# Patient Record
Sex: Male | Born: 1935 | Race: White | Hispanic: No | Marital: Married | Smoking: Former smoker
Health system: Southern US, Community
[De-identification: ages and names within clinical notes are randomized; demographics above are authoritative.]

## PROBLEM LIST (undated history)

## (undated) DIAGNOSIS — E119 Type 2 diabetes mellitus without complications: Secondary | ICD-10-CM

## (undated) DIAGNOSIS — E785 Hyperlipidemia, unspecified: Secondary | ICD-10-CM

## (undated) DIAGNOSIS — E039 Hypothyroidism, unspecified: Secondary | ICD-10-CM

## (undated) DIAGNOSIS — L570 Actinic keratosis: Secondary | ICD-10-CM

## (undated) DIAGNOSIS — I10 Essential (primary) hypertension: Secondary | ICD-10-CM

## (undated) HISTORY — DX: Actinic keratosis: L57.0

## (undated) HISTORY — PX: APPENDECTOMY: SHX54

---

## 2010-12-03 ENCOUNTER — Ambulatory Visit: Payer: Self-pay | Admitting: Ophthalmology

## 2011-01-01 ENCOUNTER — Ambulatory Visit: Payer: Self-pay | Admitting: Ophthalmology

## 2014-03-23 DIAGNOSIS — C4491 Basal cell carcinoma of skin, unspecified: Secondary | ICD-10-CM

## 2014-03-23 HISTORY — DX: Basal cell carcinoma of skin, unspecified: C44.91

## 2017-02-04 DIAGNOSIS — C4492 Squamous cell carcinoma of skin, unspecified: Secondary | ICD-10-CM

## 2017-02-04 HISTORY — DX: Squamous cell carcinoma of skin, unspecified: C44.92

## 2017-07-23 ENCOUNTER — Inpatient Hospital Stay
Admission: EM | Admit: 2017-07-23 | Discharge: 2017-07-28 | DRG: 682 | Disposition: A | Discharge status: Home Health | Payer: Medicare HMO | Attending: Internal Medicine | Admitting: Internal Medicine

## 2017-07-23 ENCOUNTER — Encounter: Payer: Self-pay | Admitting: Emergency Medicine

## 2017-07-23 ENCOUNTER — Emergency Department: Payer: Medicare HMO

## 2017-07-23 ENCOUNTER — Other Ambulatory Visit: Payer: Self-pay

## 2017-07-23 DIAGNOSIS — Z7984 Long term (current) use of oral hypoglycemic drugs: Secondary | ICD-10-CM

## 2017-07-23 DIAGNOSIS — Z79890 Hormone replacement therapy (postmenopausal): Secondary | ICD-10-CM | POA: Diagnosis not present

## 2017-07-23 DIAGNOSIS — E039 Hypothyroidism, unspecified: Secondary | ICD-10-CM | POA: Diagnosis present

## 2017-07-23 DIAGNOSIS — I951 Orthostatic hypotension: Secondary | ICD-10-CM | POA: Diagnosis present

## 2017-07-23 DIAGNOSIS — E860 Dehydration: Secondary | ICD-10-CM | POA: Diagnosis present

## 2017-07-23 DIAGNOSIS — I10 Essential (primary) hypertension: Secondary | ICD-10-CM | POA: Diagnosis present

## 2017-07-23 DIAGNOSIS — I340 Nonrheumatic mitral (valve) insufficiency: Secondary | ICD-10-CM | POA: Diagnosis not present

## 2017-07-23 DIAGNOSIS — K529 Noninfective gastroenteritis and colitis, unspecified: Secondary | ICD-10-CM | POA: Diagnosis present

## 2017-07-23 DIAGNOSIS — J189 Pneumonia, unspecified organism: Secondary | ICD-10-CM | POA: Diagnosis present

## 2017-07-23 DIAGNOSIS — R402142 Coma scale, eyes open, spontaneous, at arrival to emergency department: Secondary | ICD-10-CM | POA: Diagnosis present

## 2017-07-23 DIAGNOSIS — Z87891 Personal history of nicotine dependence: Secondary | ICD-10-CM

## 2017-07-23 DIAGNOSIS — Z833 Family history of diabetes mellitus: Secondary | ICD-10-CM | POA: Diagnosis not present

## 2017-07-23 DIAGNOSIS — E785 Hyperlipidemia, unspecified: Secondary | ICD-10-CM | POA: Diagnosis present

## 2017-07-23 DIAGNOSIS — N179 Acute kidney failure, unspecified: Secondary | ICD-10-CM | POA: Diagnosis present

## 2017-07-23 DIAGNOSIS — E119 Type 2 diabetes mellitus without complications: Secondary | ICD-10-CM | POA: Diagnosis present

## 2017-07-23 DIAGNOSIS — Z79899 Other long term (current) drug therapy: Secondary | ICD-10-CM | POA: Diagnosis not present

## 2017-07-23 DIAGNOSIS — R55 Syncope and collapse: Secondary | ICD-10-CM

## 2017-07-23 DIAGNOSIS — E871 Hypo-osmolality and hyponatremia: Secondary | ICD-10-CM | POA: Diagnosis present

## 2017-07-23 DIAGNOSIS — R402362 Coma scale, best motor response, obeys commands, at arrival to emergency department: Secondary | ICD-10-CM | POA: Diagnosis present

## 2017-07-23 DIAGNOSIS — R402252 Coma scale, best verbal response, oriented, at arrival to emergency department: Secondary | ICD-10-CM | POA: Diagnosis present

## 2017-07-23 HISTORY — DX: Type 2 diabetes mellitus without complications: E11.9

## 2017-07-23 HISTORY — DX: Hyperlipidemia, unspecified: E78.5

## 2017-07-23 HISTORY — DX: Essential (primary) hypertension: I10

## 2017-07-23 HISTORY — DX: Hypothyroidism, unspecified: E03.9

## 2017-07-23 LAB — CBC WITH DIFFERENTIAL/PLATELET
BASOS ABS: 0.1 10*3/uL (ref 0–0.1)
BASOS PCT: 1 %
EOS ABS: 0.6 10*3/uL (ref 0–0.7)
Eosinophils Relative: 7 %
HCT: 36.1 % — ABNORMAL LOW (ref 40.0–52.0)
HEMOGLOBIN: 12.7 g/dL — AB (ref 13.0–18.0)
Lymphocytes Relative: 20 %
Lymphs Abs: 1.6 10*3/uL (ref 1.0–3.6)
MCH: 30.2 pg (ref 26.0–34.0)
MCHC: 35.1 g/dL (ref 32.0–36.0)
MCV: 85.8 fL (ref 80.0–100.0)
Monocytes Absolute: 1 10*3/uL (ref 0.2–1.0)
Monocytes Relative: 12 %
NEUTROS PCT: 60 %
Neutro Abs: 4.9 10*3/uL (ref 1.4–6.5)
Platelets: 224 10*3/uL (ref 150–440)
RBC: 4.21 MIL/uL — AB (ref 4.40–5.90)
RDW: 13.2 % (ref 11.5–14.5)
WBC: 8.2 10*3/uL (ref 3.8–10.6)

## 2017-07-23 LAB — COMPREHENSIVE METABOLIC PANEL
ALBUMIN: 3.6 g/dL (ref 3.5–5.0)
ALK PHOS: 52 U/L (ref 38–126)
ALT: 11 U/L — AB (ref 17–63)
AST: 21 U/L (ref 15–41)
Anion gap: 7 (ref 5–15)
BUN: 30 mg/dL — ABNORMAL HIGH (ref 6–20)
CALCIUM: 9.2 mg/dL (ref 8.9–10.3)
CO2: 25 mmol/L (ref 22–32)
CREATININE: 1.56 mg/dL — AB (ref 0.61–1.24)
Chloride: 102 mmol/L (ref 101–111)
GFR calc Af Amer: 47 mL/min — ABNORMAL LOW (ref >60)
GFR calc non Af Amer: 41 mL/min — ABNORMAL LOW (ref >60)
GLUCOSE: 121 mg/dL — AB (ref 65–99)
Potassium: 4.7 mmol/L (ref 3.5–5.1)
SODIUM: 134 mmol/L — AB (ref 135–145)
Total Bilirubin: 1 mg/dL (ref 0.3–1.2)
Total Protein: 7.1 g/dL (ref 6.5–8.1)

## 2017-07-23 LAB — URINALYSIS, COMPLETE (UACMP) WITH MICROSCOPIC
BACTERIA UA: NONE SEEN
BILIRUBIN URINE: NEGATIVE
Glucose, UA: NEGATIVE mg/dL
Hgb urine dipstick: NEGATIVE
Ketones, ur: NEGATIVE mg/dL
Leukocytes, UA: NEGATIVE
NITRITE: NEGATIVE
PROTEIN: NEGATIVE mg/dL
SPECIFIC GRAVITY, URINE: 1.009 (ref 1.005–1.030)
Squamous Epithelial / LPF: NONE SEEN
pH: 7 (ref 5.0–8.0)

## 2017-07-23 LAB — GLUCOSE, CAPILLARY
GLUCOSE-CAPILLARY: 138 mg/dL — AB (ref 65–99)
Glucose-Capillary: 142 mg/dL — ABNORMAL HIGH (ref 65–99)
Glucose-Capillary: 89 mg/dL (ref 65–99)

## 2017-07-23 LAB — HEMOGLOBIN A1C
Hgb A1c MFr Bld: 5.8 % — ABNORMAL HIGH (ref 4.8–5.6)
MEAN PLASMA GLUCOSE: 119.76 mg/dL

## 2017-07-23 LAB — TSH: TSH: 0.627 u[IU]/mL (ref 0.350–4.500)

## 2017-07-23 LAB — TROPONIN I: Troponin I: <0.03 ng/mL (ref <0.03)

## 2017-07-23 LAB — CK: Total CK: 66 U/L (ref 49–397)

## 2017-07-23 MED ORDER — TIMOLOL MALEATE 0.5 % OP SOLN
1.0000 [drp] | Freq: Every day | OPHTHALMIC | Status: DC
  Administered 2017-07-23 – 2017-07-27 (×4): 1 [drp] via OPHTHALMIC
  Filled 2017-07-23: qty 5

## 2017-07-23 MED ORDER — EZETIMIBE-SIMVASTATIN 10-40 MG PO TABS
1.0000 | ORAL_TABLET | Freq: Every day | ORAL | Status: DC
  Filled 2017-07-23: qty 1

## 2017-07-23 MED ORDER — SODIUM CHLORIDE 0.9 % IV BOLUS
1000.0000 mL | Freq: Once | INTRAVENOUS | Status: AC
Start: 2017-07-23 — End: 2017-07-23
  Administered 2017-07-23: 1000 mL via INTRAVENOUS

## 2017-07-23 MED ORDER — ACETAMINOPHEN 500 MG PO TABS
1000.0000 mg | ORAL_TABLET | Freq: Once | ORAL | Status: AC
  Administered 2017-07-23: 1000 mg via ORAL
  Filled 2017-07-23: qty 2

## 2017-07-23 MED ORDER — OXYCODONE HCL 5 MG PO TABS
5.0000 mg | ORAL_TABLET | ORAL | Status: DC | PRN
  Administered 2017-07-23 – 2017-07-24 (×5): 5 mg via ORAL
  Filled 2017-07-23 (×7): qty 1

## 2017-07-23 MED ORDER — OXYCODONE HCL 5 MG PO TABS
5.0000 mg | ORAL_TABLET | Freq: Once | ORAL | Status: AC
  Administered 2017-07-23: 5 mg via ORAL
  Filled 2017-07-23: qty 1

## 2017-07-23 MED ORDER — ATORVASTATIN CALCIUM 20 MG PO TABS
40.0000 mg | ORAL_TABLET | Freq: Every day | ORAL | Status: DC
  Administered 2017-07-23 – 2017-07-27 (×5): 40 mg via ORAL
  Filled 2017-07-23 (×6): qty 2

## 2017-07-23 MED ORDER — ONDANSETRON HCL 4 MG/2ML IJ SOLN
4.0000 mg | Freq: Four times a day (QID) | INTRAMUSCULAR | Status: DC | PRN
  Administered 2017-07-24 – 2017-07-25 (×4): 4 mg via INTRAVENOUS
  Filled 2017-07-23 (×4): qty 2

## 2017-07-23 MED ORDER — ONDANSETRON HCL 4 MG/2ML IJ SOLN
4.0000 mg | Freq: Once | INTRAMUSCULAR | Status: AC
  Administered 2017-07-23: 4 mg via INTRAVENOUS
  Filled 2017-07-23: qty 2

## 2017-07-23 MED ORDER — ACETAMINOPHEN 325 MG PO TABS
650.0000 mg | ORAL_TABLET | Freq: Four times a day (QID) | ORAL | Status: DC | PRN
  Administered 2017-07-26 – 2017-07-28 (×5): 650 mg via ORAL
  Filled 2017-07-23 (×5): qty 2

## 2017-07-23 MED ORDER — INSULIN ASPART 100 UNIT/ML ~~LOC~~ SOLN
0.0000 [IU] | Freq: Three times a day (TID) | SUBCUTANEOUS | Status: DC
  Administered 2017-07-23 – 2017-07-24 (×2): 1 [IU] via SUBCUTANEOUS
  Administered 2017-07-26: 2 [IU] via SUBCUTANEOUS
  Administered 2017-07-27: 13:00:00 1 [IU] via SUBCUTANEOUS
  Filled 2017-07-23 (×4): qty 1

## 2017-07-23 MED ORDER — LEVOTHYROXINE SODIUM 50 MCG PO TABS
50.0000 ug | ORAL_TABLET | Freq: Every day | ORAL | Status: DC
  Administered 2017-07-24 – 2017-07-27 (×4): 50 ug via ORAL
  Filled 2017-07-23: qty 1
  Filled 2017-07-23: qty 2
  Filled 2017-07-23 (×2): qty 1

## 2017-07-23 MED ORDER — TIMOLOL MALEATE 0.5 % OP SOLN
1.0000 [drp] | Freq: Every day | OPHTHALMIC | Status: DC
  Filled 2017-07-23: qty 5

## 2017-07-23 MED ORDER — EZETIMIBE 10 MG PO TABS
10.0000 mg | ORAL_TABLET | Freq: Every day | ORAL | Status: DC
Start: 2017-07-23 — End: 2017-07-28
  Administered 2017-07-23 – 2017-07-27 (×5): 10 mg via ORAL
  Filled 2017-07-23 (×7): qty 1

## 2017-07-23 MED ORDER — ONDANSETRON HCL 4 MG PO TABS
4.0000 mg | ORAL_TABLET | Freq: Four times a day (QID) | ORAL | Status: DC | PRN
  Administered 2017-07-25 (×2): 4 mg via ORAL
  Filled 2017-07-23 (×2): qty 1

## 2017-07-23 MED ORDER — ENOXAPARIN SODIUM 40 MG/0.4ML ~~LOC~~ SOLN
40.0000 mg | SUBCUTANEOUS | Status: DC
  Administered 2017-07-24 – 2017-07-27 (×2): 40 mg via SUBCUTANEOUS
  Filled 2017-07-23 (×3): qty 0.4

## 2017-07-23 MED ORDER — MORPHINE SULFATE (PF) 2 MG/ML IV SOLN
2.0000 mg | INTRAVENOUS | Status: DC | PRN
  Administered 2017-07-24 – 2017-07-25 (×3): 2 mg via INTRAVENOUS
  Filled 2017-07-23 (×3): qty 1

## 2017-07-23 MED ORDER — INSULIN ASPART 100 UNIT/ML ~~LOC~~ SOLN: 0.0000 [IU] | Freq: Every day | SUBCUTANEOUS | Status: DC

## 2017-07-23 MED ORDER — TAMSULOSIN HCL 0.4 MG PO CAPS
0.4000 mg | ORAL_CAPSULE | Freq: Every day | ORAL | Status: DC
  Administered 2017-07-24 – 2017-07-27 (×4): 0.4 mg via ORAL
  Filled 2017-07-23 (×4): qty 1

## 2017-07-23 MED ORDER — SODIUM CHLORIDE 0.9 % IV SOLN
INTRAVENOUS | Status: DC
  Administered 2017-07-23 – 2017-07-24 (×2): via INTRAVENOUS

## 2017-07-23 MED ORDER — ACETAMINOPHEN 650 MG RE SUPP: 650.0000 mg | Freq: Four times a day (QID) | RECTAL | Status: DC | PRN

## 2017-07-23 NOTE — ED Provider Notes (Signed)
[facility] Emergency Department Provider Note  ____________________________________________  Time seen: Approximately 9:43 AM  I have reviewed the triage vital signs and the nursing notes.   HISTORY  Chief Complaint Weakness   HPI Richard Acosta is a 82 y.o. male with a history of diabetes, hypertension, hypothyroidism who presents for evaluation of a syncopal event.  Patient reports that he got up and went to the bathroom to take his thyroid medication at 5:30 AM.  He felt dizzy and passed out.  He denies hurting himself after he collapsed.  His wife heard him and by the time she got to the bathroom he was already awake.  No seizure-like activity, no postictal state.  Patient is not on blood thinners.  He denies headache, chest pain, neck pain, back pain, extremity pain.  Patient denies any preceding symptoms other than dizziness prior to the syncope.  He denies chest pain shortness of breath, abdominal pain.  Patient does report having diarrhea over the weekend however that has subsided.  The wife reports the patient has not been eating and drinking well for a few weeks.  She is concerned he is dehydrated.  Past Medical History:  Diagnosis Date  . Diabetes mellitus without complication ([place])   . Hypertension     There are no active problems to display for this patient.   Past Surgical History:  Procedure Laterality Date  . APPENDECTOMY      Prior to Admission medications   Medication Sig Start Date End Date Taking? Authorizing Provider  ciprofloxacin (CIPRO) 500 MG tablet Take 500 mg by mouth daily with breakfast.   Yes [provider]  ezetimibe-simvastatin (VYTORIN) 10-40 MG tablet Take 1 tablet by mouth daily.   Yes [provider]  levothyroxine (SYNTHROID, LEVOTHROID) 50 MCG tablet Take 50 mcg by mouth daily before breakfast.   Yes [provider]  lisinopril (PRINIVIL,ZESTRIL) 10 MG tablet Take 10 mg by mouth daily.    Yes [provider]  metFORMIN (GLUCOPHAGE) 1000 MG tablet Take 1,000 mg by mouth daily with breakfast.   Yes [provider]  tamsulosin (FLOMAX) 0.4 MG CAPS capsule Take 0.4 mg by mouth daily after supper.   Yes [provider]  timolol (BETIMOL) 0.5 % ophthalmic solution Place 1 drop into both eyes daily.   Yes [provider]    Allergies Patient has no known allergies.  No family history on file.  Social History Social History   Tobacco Use  . Smoking status: Former Research scientist (life sciences)  . Smokeless tobacco: Never Used  Substance Use Topics  . Alcohol use: Yes    Alcohol/week: 1.2 oz    Types: 2 Glasses of wine per week  . Drug use: Not Currently    Review of Systems  Constitutional: Negative for fever. + syncope Eyes: Negative for visual changes. ENT: Negative for sore throat. Neck: No neck pain  Cardiovascular: Negative for chest pain. Respiratory: Negative for shortness of breath. Gastrointestinal: Negative for abdominal pain, vomiting. + diarrhea. Genitourinary: Negative for dysuria. Musculoskeletal: Negative for back pain. Skin: Negative for rash. Neurological: Negative for headaches, weakness or numbness. Psych: No SI or HI  ____________________________________________   PHYSICAL EXAM:  VITAL SIGNS: ED Triage Vitals  Enc Vitals Group     BP 07/23/17 0807 (!) 111/97     Pulse Rate 07/23/17 0807 71     Resp 07/23/17 0807 19     Temp 07/23/17 0807 97.8 F (36.6 C)  Temp Source 07/23/17 0807 Oral     SpO2 07/23/17 0807 100 %     Weight 07/23/17 0808 157 lb (71.2 kg)     Height 07/23/17 0808 5\' 6"  (1.676 m)     Head Circumference --      Peak Flow --      Pain Score 07/23/17 0807 5     Pain Loc --      Pain Edu? --      Excl. in [place]? --    Full spinal precautions maintained throughout the trauma exam. Constitutional: Alert and oriented. No acute distress. Does not appear intoxicated. HEENT Head: Normocephalic and  atraumatic. Face: No facial bony tenderness. Stable midface Ears: No hemotympanum bilaterally. No Battle sign Eyes: No eye injury. PERRL. No raccoon eyes Nose: Nontender. No epistaxis. No rhinorrhea Mouth/Throat: Mucous membranes are dry. No oropharyngeal blood. No dental injury. Airway patent without stridor. Normal voice. Neck: no C-collar in place. No midline c-spine tenderness.  Cardiovascular: Normal rate, regular rhythm. Normal and symmetric distal pulses are present in all extremities. Pulmonary/Chest: Chest wall is stable and nontender to palpation/compression. Normal respiratory effort. Breath sounds are normal. No crepitus.  Abdominal: Soft, nontender, non distended. Musculoskeletal: Nontender with normal full range of motion in all extremities. No deformities. No thoracic or lumbar midline spinal tenderness. Pelvis is stable. Skin: Skin is warm, dry and intact. No abrasions or contutions. Psychiatric: Speech and behavior are appropriate. Neurological: Normal speech and language. Moves all extremities to command. No gross focal neurologic deficits are appreciated.  Glascow Coma Score: 4 - Opens eyes on own 6 - Follows simple motor commands 5 - Alert and oriented GCS: 15  ____________________________________________   LABS (all labs ordered are listed, but only abnormal results are displayed)  Labs Reviewed  CBC WITH DIFFERENTIAL/PLATELET - Abnormal; Notable for the following components:      Result Value   RBC 4.21 (*)    Hemoglobin 12.7 (*)    HCT 36.1 (*)    All other components within normal limits  COMPREHENSIVE METABOLIC PANEL - Abnormal; Notable for the following components:   Sodium 134 (*)    Glucose, Bld 121 (*)    BUN 30 (*)    Creatinine, Ser 1.56 (*)    ALT 11 (*)    GFR calc non Af Amer 41 (*)    GFR calc Af Amer 47 (*)    All other components within normal limits  URINALYSIS, COMPLETE (UACMP) WITH MICROSCOPIC - Abnormal; Notable for the following  components:   Color, Urine STRAW (*)    APPearance CLEAR (*)    All other components within normal limits  TROPONIN I   ____________________________________________  EKG  ED ECG REPORT I, Rudene Re, the attending physician, personally viewed and interpreted this ECG.  Normal sinus rhythm, normal intervals, normal axis, no STE or depressions, no evidence of HOCM, AV block, delta wave, ARVD, prolonged QTc, WPW, or [place].  No prior for comparison.  ____________________________________________  RADIOLOGY  I have personally reviewed the images performed during this visit and I agree with the Radiologist's read.   Interpretation by Radiologist:  Ct Head Wo Contrast  Result Date: 07/23/2017 CLINICAL DATA:  Dizziness and syncope.  Fall EXAM: CT HEAD WITHOUT CONTRAST TECHNIQUE: Contiguous axial images were obtained from the base of the skull through the vertex without intravenous contrast. COMPARISON:  None. FINDINGS: Brain: There is age related volume loss. There is no intracranial mass, hemorrhage, extra-axial fluid collection, or midline shift.  There is mild small vessel disease in the centra semiovale bilaterally. Elsewhere gray-white compartments appear normal. No evident acute infarct. Vascular: No hyperdense vessels. There is minimal calcification in each carotid siphon. Skull: Bony calvarium appears intact. Sinuses/Orbits: There is mucosal thickening in several ethmoid air cells. Other visualized paranasal sinuses are clear. Visualized orbits appear symmetric bilaterally. Other: Visualized mastoid air cells are clear. IMPRESSION: Age related volume loss with slight periventricular small vessel disease. No acute infarct. No mass or hemorrhage. Minimal arterial vascular calcification noted. Mucosal thickening noted in several ethmoid air cells. Electronically Signed   By: Lowella Grip III M.D.   On: 07/23/2017 09:17      ____________________________________________   PROCEDURES  Procedure(s) performed: None Procedures Critical Care performed:  None ____________________________________________   INITIAL IMPRESSION / ASSESSMENT AND PLAN / ED COURSE   82 y.o. male with a history of diabetes, hypertension, hypothyroidism who presents for evaluation of a syncopal event in the setting of recent diarrheal illness and decreased oral intake.  No evidence of trauma on exam however since syncope was unwitnessed I will send patient for CT head to rule out intracranial injury.  EKG with no evidence of dysrhythmias or ischemia.  Vitals are within normal limits but patient is orthostatic.  Labs showing acute kidney injury.  Presentation concerning for dehydration.  Patient will receive 1 L normal saline and will be admitted to the hospitalist service.      As part of my medical decision making, I reviewed the following data within the [place] History obtained from family, Nursing notes reviewed and incorporated, Labs reviewed , EKG interpreted , Radiograph reviewed , Discussed with admitting physician , Notes from prior ED visits and [place] Controlled Substance Database    Pertinent labs & imaging results that were available during my care of the patient were reviewed by me and considered in my medical decision making (see chart for details).    ____________________________________________   FINAL CLINICAL IMPRESSION(S) / ED DIAGNOSES  Final diagnoses:  AKI (acute kidney injury) ([place])  Syncope, unspecified syncope type      NEW MEDICATIONS STARTED DURING THIS VISIT:  ED Discharge Orders    None       Note:  This document was prepared using Dragon voice recognition software and may include unintentional dictation errors.    Rudene Re, MD 07/23/17 [phone]

## 2017-07-23 NOTE — ED Notes (Signed)
Pt has not eaten meal today and doesn't want [place] tray. Pt will order meal when he arrives to room

## 2017-07-23 NOTE — H&P (Signed)
Richard Acosta at [place] NAME: Richard Acosta    MR#:  [id number]  DATE OF BIRTH:  [birth year]  DATE OF ADMISSION:  07/23/2017  PRIMARY CARE PHYSICIAN:  Dr. Ronnald Collum  REQUESTING/REFERRING PHYSICIAN: Dr. Rudene Re  CHIEF COMPLAINT:   Chief Complaint  Patient presents with  . Weakness    HISTORY OF PRESENT ILLNESS:  Richard Acosta  is a 82 y.o. male with a known history of hypertension, non-insulin-dependent diabetes mellitus, hypothyroidism presents to hospital secondary to dizziness and syncopal episode. Patient has been having low back pain and generalized body aches and joint pains for almost a month.  He has become very weak.  4 days ago he has had several diarrhea episodes which were self-limited.  Denies any fevers or chills.  No nausea or vomiting.  No cough or chest pain or congestion.  His appetite has been very poor over the last month.  Denies any melena or dark stools.  Lost about 5 pounds in the last month.  He has been eating and drinking very minimal.  When he woke up this morning and stood to get his medicine, he felt dizzy and then passed out.  He is positive with orthostatic hypotension here.  Labs indicate acute renal failure.  He is being admitted for the same.  PAST MEDICAL HISTORY:   Past Medical History:  Diagnosis Date  . Diabetes mellitus without complication ([place])   . Hyperlipidemia   . Hypertension   . Hypothyroidism     PAST SURGICAL HISTORY:   Past Surgical History:  Procedure Laterality Date  . APPENDECTOMY      SOCIAL HISTORY:   Social History   Tobacco Use  . Smoking status: Former Research scientist (life sciences)  . Smokeless tobacco: Never Used  . Tobacco comment: quit about 50 years ago  Substance Use Topics  . Alcohol use: Not Currently    Alcohol/week: 1.2 oz    Types: 2 Glasses of wine per week    Frequency: Never    FAMILY HISTORY:   Family History  Problem Relation Age of Onset  . Diabetes Mother   .  Diabetes Father     DRUG ALLERGIES:  No Known Allergies  REVIEW OF SYSTEMS:   Review of Systems  Constitutional: Positive for malaise/fatigue and weight loss. Negative for chills and fever.  HENT: Negative for ear discharge, ear pain, hearing loss, nosebleeds and tinnitus.   Eyes: Negative for blurred vision, double vision and photophobia.  Respiratory: Negative for cough, hemoptysis, shortness of breath and wheezing.   Cardiovascular: Negative for chest pain, palpitations, orthopnea and leg swelling.  Gastrointestinal: Positive for diarrhea. Negative for abdominal pain, constipation, heartburn, melena, nausea and vomiting.  Genitourinary: Negative for dysuria, frequency, hematuria and urgency.  Musculoskeletal: Positive for back pain, joint pain and myalgias. Negative for neck pain.  Skin: Negative for rash.  Neurological: Positive for dizziness. Negative for tingling, tremors, sensory change, speech change, focal weakness and headaches.  Endo/Heme/Allergies: Does not bruise/bleed easily.  Psychiatric/Behavioral: Negative for depression.    MEDICATIONS AT HOME:   Prior to Admission medications   Medication Sig Start Date End Date Taking? Authorizing Provider  ciprofloxacin (CIPRO) 500 MG tablet Take 500 mg by mouth daily with breakfast.   Yes [provider]  ezetimibe-simvastatin (VYTORIN) 10-40 MG tablet Take 1 tablet by mouth daily.   Yes [provider]  levothyroxine (SYNTHROID, LEVOTHROID) 50 MCG tablet Take 50 mcg by mouth daily before breakfast.   Yes  [provider]  lisinopril (PRINIVIL,ZESTRIL) 10 MG tablet Take 10 mg by mouth daily.   Yes [provider]  metFORMIN (GLUCOPHAGE) 1000 MG tablet Take 1,000 mg by mouth daily with breakfast.   Yes [provider]  tamsulosin (FLOMAX) 0.4 MG CAPS capsule Take 0.4 mg by mouth daily after supper.   Yes [provider]  timolol (BETIMOL) 0.5 % ophthalmic solution Place 1 drop  into both eyes daily.   Yes [provider]      VITAL SIGNS:  Blood pressure (!) 111/97, pulse 71, temperature 97.8 F (36.6 C), temperature source Oral, resp. rate 19, height 5\' 6"  (1.676 m), weight 71.2 kg (157 lb), SpO2 100 %.  PHYSICAL EXAMINATION:   Physical Exam  GENERAL:  82 y.o.-year-old patient lying in the bed with no acute distress.  EYES: Pupils equal, round, reactive to light and accommodation. No scleral icterus. Extraocular muscles intact.  HEENT: Head atraumatic, normocephalic. Oropharynx and nasopharynx clear.  NECK:  Supple, no jugular venous distention. No thyroid enlargement, no tenderness.  LUNGS: Normal breath sounds bilaterally, no wheezing, rales,rhonchi or crepitation. No use of accessory muscles of respiration.  CARDIOVASCULAR: S1, S2 normal. No murmurs, rubs, or gallops.  ABDOMEN: Soft, nontender, nondistended. Bowel sounds present. No organomegaly or mass.  EXTREMITIES: No pedal edema, cyanosis, or clubbing. No joint swelling noted. NEUROLOGIC: Cranial nerves II through XII are intact. Muscle strength 5/5 in all extremities. Sensation intact. Gait not checked. Global weakness PSYCHIATRIC: The patient is alert and oriented x 3.  SKIN: No obvious rash, lesion, or ulcer.   LABORATORY PANEL:   CBC Recent Labs  Lab 07/23/17 0817  WBC 8.2  HGB 12.7*  HCT 36.1*  PLT 224   ------------------------------------------------------------------------------------------------------------------  Chemistries  Recent Labs  Lab 07/23/17 0817  NA 134*  K 4.7  CL 102  CO2 25  GLUCOSE 121*  BUN 30*  CREATININE 1.56*  CALCIUM 9.2  AST 21  ALT 11*  ALKPHOS 52  BILITOT 1.0   ------------------------------------------------------------------------------------------------------------------  Cardiac Enzymes Recent Labs  Lab 07/23/17 0817  TROPONINI <0.03    ------------------------------------------------------------------------------------------------------------------  RADIOLOGY:  Ct Head Wo Contrast  Result Date: 07/23/2017 CLINICAL DATA:  Dizziness and syncope.  Fall EXAM: CT HEAD WITHOUT CONTRAST TECHNIQUE: Contiguous axial images were obtained from the base of the skull through the vertex without intravenous contrast. COMPARISON:  None. FINDINGS: Brain: There is age related volume loss. There is no intracranial mass, hemorrhage, extra-axial fluid collection, or midline shift. There is mild small vessel disease in the centra semiovale bilaterally. Elsewhere gray-white compartments appear normal. No evident acute infarct. Vascular: No hyperdense vessels. There is minimal calcification in each carotid siphon. Skull: Bony calvarium appears intact. Sinuses/Orbits: There is mucosal thickening in several ethmoid air cells. Other visualized paranasal sinuses are clear. Visualized orbits appear symmetric bilaterally. Other: Visualized mastoid air cells are clear. IMPRESSION: Age related volume loss with slight periventricular small vessel disease. No acute infarct. No mass or hemorrhage. Minimal arterial vascular calcification noted. Mucosal thickening noted in several ethmoid air cells. Electronically Signed   By: Lowella Grip III M.D.   On: 07/23/2017 09:17    EKG:   Orders placed or performed during the hospital encounter of 07/23/17  . EKG 12-Lead  . EKG 12-Lead  . ED EKG  . ED EKG    IMPRESSION AND PLAN:   Richard Acosta  is a 82 y.o. male with a known history of hypertension, non-insulin-dependent diabetes mellitus, hypothyroidism presents to hospital secondary  to dizziness and syncopal episode.  1. ARF-prerenal causes, dehydration -IV fluids.  Hold metformin and lisinopril for now -Monitor renal function  2.  Orthostatic hypotension-secondary to poor p.o. intake and dehydration -Hold blood pressure medications.  IV fluids and check  orthostatics  3.  Hypothyroidism-continue Synthroid, check TSH  4.  Generalized weakness-be secondary to dehydration.  However weight loss and joint pains for over a month.  Recommend outpatient follow-up for age-related cancer workup. -ANA panel ordered.  Check TSH. -Physical therapy consulted  5.  Diabetes mellitus-Metformin on hold.  Sliding scale insulin.  Follow-up A1c  6.  DVT prophylaxis-Lovenox   At discharge patient wants to follow up with Dr. Harrel Lemon from [facility]- please get an appointment for the same.   All the records are reviewed and case discussed with ED provider. Management plans discussed with the patient, family and they are in agreement.  CODE STATUS: Full Code  TOTAL TIME TAKING CARE OF THIS PATIENT: 50 minutes.    Gladstone Lighter M.D on 07/23/2017 at 10:18 AM  Between 7am to 6pm - Pager - [phone]  After 6pm go to [email or web address] - password [email or web address] [facility]  Office  [phone]  CC: Primary care physician; System, Pcp Not In

## 2017-07-23 NOTE — ED Notes (Signed)
Patient transported to CT 

## 2017-07-23 NOTE — Evaluation (Signed)
Physical Therapy Evaluation Patient Details Name: Richard Acosta MRN: [id number] DOB: [birth year] Today's Date: 07/23/2017   History of Present Illness  82 y.o. male with a known history of hypertension, non-insulin-dependent diabetes mellitus, hypothyroidism presents to hospital secondary to dizziness and syncopal episode.  Clinical Impression  Pt did well with PT exam showing ability to get to sitting, standing and then walk around the nurses' station (and negotiate up/down steps) safely and with good confidence.  He reports that he is not having more pain than his typical chronic joint pain and is near his baseline.  Overall he did well and agrees that he will not need further PT f/u.    Follow Up Recommendations No PT follow up    Equipment Recommendations       Recommendations for Other Services       Precautions / Restrictions Precautions Precautions: Fall Restrictions Weight Bearing Restrictions: No      Mobility  Bed Mobility Overal bed mobility: Modified Independent             General bed mobility comments: Pt with light struggle to get to sitting at EOB, able to rise w/o assist  Transfers Overall transfer level: Independent Equipment used: None             General transfer comment: Pt able to get to standing w/o issue, good confidence and balance  Ambulation/Gait Ambulation/Gait assistance: Independent Ambulation Distance (Feet): 250 Feet Assistive device: None       General Gait Details: Pt able to ambulate with consistent, community appropriate cadence and speed.  He did have some fatigue with the effort (O2 remained in the 90s, no significant HR increase). Pt safe and reports being near his baseline.   Stairs Stairs: Yes Stairs assistance: Modified independent (Device/Increase time) Stair Management: No rails Number of Stairs: 6 General stair comments: Pt able to negotiate up/down steps w/o rails, step-to strategy while descending.    Wheelchair Mobility    Modified Rankin (Stroke Patients Only)       Balance Overall balance assessment: Independent                                           Pertinent Vitals/Pain Pain Assessment: (general chronic joint soreness)    Home Living Family/patient expects to be discharged to:: Private residence Living Arrangements: Spouse/significant other Available Help at Discharge: Family   Home Access: Stairs to enter Entrance Stairs-Rails: None Entrance Stairs-Number of Steps: 1 Home Layout: One level        Prior Function Level of Independence: Independent with assistive device(s);Independent         Comments: Pt still working, drives, able to run light errands     Hand Dominance        Extremity/Trunk Assessment   Upper Extremity Assessment Upper Extremity Assessment: Overall WFL for tasks assessed    Lower Extremity Assessment Lower Extremity Assessment: Overall WFL for tasks assessed       Communication   Communication: No difficulties  Cognition Arousal/Alertness: Awake/alert Behavior During Therapy: WFL for tasks assessed/performed Overall Cognitive Status: Within Functional Limits for tasks assessed                                        General Comments      Exercises  Assessment/Plan    PT Assessment Patent does not need any further PT services  PT Problem List         PT Treatment Interventions      PT Goals (Current goals can be found in the Care Plan section)  Acute Rehab PT Goals Patient Stated Goal: go home PT Goal Formulation: All assessment and education complete, DC therapy    Frequency     Barriers to discharge        Co-evaluation               AM-PAC PT "6 Clicks" Daily Activity  Outcome Measure Difficulty turning over in bed (including adjusting bedclothes, sheets and blankets)?: None Difficulty moving from lying on back to sitting on the side of the bed? : A  Little Difficulty sitting down on and standing up from a chair with arms (e.g., wheelchair, bedside commode, etc,.)?: None Help needed moving to and from a bed to chair (including a wheelchair)?: None Help needed walking in hospital room?: None Help needed climbing 3-5 steps with a railing? : None 6 Click Score: 23    End of Session Equipment Utilized During Treatment: Gait belt Activity Tolerance: Patient tolerated treatment well Patient left: with bed alarm set;with call bell/phone within reach;with family/visitor present   PT Visit Diagnosis: Muscle weakness (generalized) (M62.81)    Time: 1287-8676 PT Time Calculation (min) (ACUTE ONLY): 22 min   Charges:   PT Evaluation $PT Eval Low Complexity: 1 Low     PT G CodesKreg Shropshire, DPT 07/23/2017, 3:19 PM

## 2017-07-23 NOTE — ED Notes (Signed)
Called lab and spoke with Manuela Schwartz. Per Manuela Schwartz she is going to check to see if there is enough blood in lab to add on CK and ANA comprehensive panel. Per Manuela Schwartz she will call me back and let me know

## 2017-07-23 NOTE — ED Triage Notes (Signed)
Pt arrives via [facility] from home after a syncopal episode this morning. Pt reports being dizzy prior to falling and "doesn't think he hit his head." Pt states he has had diarrhea Friday-Sunday "several times." Denies N/V.   Pt does not take anticoagulants. Pt has hx HTN.

## 2017-07-24 ENCOUNTER — Inpatient Hospital Stay: Payer: Medicare HMO

## 2017-07-24 ENCOUNTER — Inpatient Hospital Stay (HOSPITAL_COMMUNITY)
Admit: 2017-07-24 | Discharge: 2017-07-24 | Disposition: A | Discharge status: Home / Self Care | Payer: Medicare HMO | Attending: Internal Medicine | Admitting: Internal Medicine

## 2017-07-24 DIAGNOSIS — I340 Nonrheumatic mitral (valve) insufficiency: Secondary | ICD-10-CM

## 2017-07-24 LAB — ANA COMPREHENSIVE PANEL
Anti JO-1: <0.2 AI (ref 0.0–0.9)
Chromatin Ab SerPl-aCnc: <0.2 AI (ref 0.0–0.9)
ENA SM Ab Ser-aCnc: <0.2 AI (ref 0.0–0.9)
RIBONUCLEIC PROTEIN: 0.9 AI (ref 0.0–0.9)
SSA (Ro) (ENA) Antibody, IgG: <0.2 AI (ref 0.0–0.9)
Scleroderma (Scl-70) (ENA) Antibody, IgG: <0.2 AI (ref 0.0–0.9)

## 2017-07-24 LAB — BASIC METABOLIC PANEL
ANION GAP: 5 (ref 5–15)
Anion gap: 3 — ABNORMAL LOW (ref 5–15)
BUN: 26 mg/dL — AB (ref 6–20)
BUN: 23 mg/dL — ABNORMAL HIGH (ref 6–20)
CHLORIDE: 103 mmol/L (ref 101–111)
CO2: 24 mmol/L (ref 22–32)
CO2: 24 mmol/L (ref 22–32)
Calcium: 8.6 mg/dL — ABNORMAL LOW (ref 8.9–10.3)
Calcium: 8.7 mg/dL — ABNORMAL LOW (ref 8.9–10.3)
Chloride: 107 mmol/L (ref 101–111)
Creatinine, Ser: 1.56 mg/dL — ABNORMAL HIGH (ref 0.61–1.24)
Creatinine, Ser: 1.46 mg/dL — ABNORMAL HIGH (ref 0.61–1.24)
GFR calc Af Amer: 47 mL/min — ABNORMAL LOW (ref >60)
GFR calc non Af Amer: 44 mL/min — ABNORMAL LOW (ref >60)
GFR, EST AFRICAN AMERICAN: 51 mL/min — AB (ref >60)
GFR, EST NON AFRICAN AMERICAN: 41 mL/min — AB (ref >60)
Glucose, Bld: 114 mg/dL — ABNORMAL HIGH (ref 65–99)
Glucose, Bld: 134 mg/dL — ABNORMAL HIGH (ref 65–99)
POTASSIUM: 4.3 mmol/L (ref 3.5–5.1)
POTASSIUM: 3.8 mmol/L (ref 3.5–5.1)
SODIUM: 134 mmol/L — AB (ref 135–145)
Sodium: 132 mmol/L — ABNORMAL LOW (ref 135–145)

## 2017-07-24 LAB — CBC
HEMATOCRIT: 35.2 % — AB (ref 40.0–52.0)
Hemoglobin: 12.3 g/dL — ABNORMAL LOW (ref 13.0–18.0)
MCH: 30.4 pg (ref 26.0–34.0)
MCHC: 35 g/dL (ref 32.0–36.0)
MCV: 87 fL (ref 80.0–100.0)
Platelets: 198 10*3/uL (ref 150–440)
RBC: 4.04 MIL/uL — ABNORMAL LOW (ref 4.40–5.90)
RDW: 12.9 % (ref 11.5–14.5)
WBC: 6.2 10*3/uL (ref 3.8–10.6)

## 2017-07-24 LAB — GLUCOSE, CAPILLARY
GLUCOSE-CAPILLARY: 121 mg/dL — AB (ref 65–99)
GLUCOSE-CAPILLARY: 106 mg/dL — AB (ref 65–99)
GLUCOSE-CAPILLARY: 105 mg/dL — AB (ref 65–99)
Glucose-Capillary: 89 mg/dL (ref 65–99)

## 2017-07-24 LAB — ECHOCARDIOGRAM COMPLETE
HEIGHTINCHES: 66 in
WEIGHTICAEL: 2512 [oz_av]

## 2017-07-24 MED ORDER — SODIUM CHLORIDE 0.9 % IV SOLN
INTRAVENOUS | Status: DC
  Administered 2017-07-24 (×2): via INTRAVENOUS

## 2017-07-24 MED ORDER — ENSURE ENLIVE PO LIQD
237.0000 mL | Freq: Two times a day (BID) | ORAL | Status: DC
  Administered 2017-07-26 (×2): 237 mL via ORAL

## 2017-07-24 MED ORDER — CIPROFLOXACIN IN D5W 200 MG/100ML IV SOLN
200.0000 mg | Freq: Two times a day (BID) | INTRAVENOUS | Status: DC
  Administered 2017-07-24 – 2017-07-26 (×4): 200 mg via INTRAVENOUS
  Filled 2017-07-24 (×6): qty 100

## 2017-07-24 MED ORDER — IOHEXOL 300 MG/ML  SOLN
75.0000 mL | Freq: Once | INTRAMUSCULAR | Status: AC | PRN
  Administered 2017-07-24: 75 mL via INTRAVENOUS

## 2017-07-24 MED ORDER — ADULT MULTIVITAMIN W/MINERALS CH
1.0000 | ORAL_TABLET | Freq: Every day | ORAL | Status: DC
  Administered 2017-07-25 – 2017-07-28 (×4): 1 via ORAL
  Filled 2017-07-24 (×4): qty 1

## 2017-07-24 MED ORDER — IOPAMIDOL (ISOVUE-300) INJECTION 61%
15.0000 mL | INTRAVENOUS | Status: AC
  Administered 2017-07-24 (×2): 15 mL via ORAL

## 2017-07-24 MED ORDER — METRONIDAZOLE IN NACL 5-0.79 MG/ML-% IV SOLN
500.0000 mg | Freq: Three times a day (TID) | INTRAVENOUS | Status: DC
  Administered 2017-07-24 – 2017-07-26 (×5): 500 mg via INTRAVENOUS
  Filled 2017-07-24 (×9): qty 100

## 2017-07-24 NOTE — Progress Notes (Signed)
*  PRELIMINARY RESULTS* Echocardiogram 2D Echocardiogram has been performed.  Richard Acosta 07/24/2017, 2:48 PM

## 2017-07-24 NOTE — Progress Notes (Signed)
[facility] [facility] [place] NAME: Richard Acosta    MR#:  [id number]  DATE OF BIRTH:  01-27-1936  SUBJECTIVE:  CHIEF COMPLAINT:   Chief Complaint  Patient presents with  . Weakness    came with weakness, fatigue, shortness of breath with minimal exertion, pain in all the joints.  No fever but have some chills.    REVIEW OF SYSTEMS:  CONSTITUTIONAL: No fever, he have fatigue or weakness.  EYES: No blurred or double vision.  EARS, NOSE, AND THROAT: No tinnitus or ear pain.  RESPIRATORY: No cough, shortness of breath, wheezing or hemoptysis.  CARDIOVASCULAR: No chest pain, orthopnea, edema.  GASTROINTESTINAL: No nausea, vomiting, diarrhea or abdominal pain.  GENITOURINARY: No dysuria, hematuria.  ENDOCRINE: No polyuria, nocturia,  HEMATOLOGY: No anemia, easy bruising or bleeding SKIN: No rash or lesion. MUSCULOSKELETAL: No joint pain or arthritis.   NEUROLOGIC: No tingling, numbness, weakness.  PSYCHIATRY: No anxiety or depression.   ROS  DRUG ALLERGIES:  No Known Allergies  VITALS:  Blood pressure (!) 190/87, pulse 70, temperature 98.3 F (36.8 C), temperature source Oral, resp. rate 20, height 5\' 6"  (1.676 m), weight 71.2 kg (157 lb), SpO2 100 %.  PHYSICAL EXAMINATION:  GENERAL:  82 y.o.-year-old patient lying in the bed with no acute distress.  EYES: Pupils equal, round, reactive to light and accommodation. No scleral icterus. Extraocular muscles intact.  HEENT: Head atraumatic, normocephalic. Oropharynx and nasopharynx clear.  NECK:  Supple, no jugular venous distention. No thyroid enlargement, no tenderness.  LUNGS: Normal breath sounds bilaterally, no wheezing, rales,rhonchi or crepitation. No use of accessory muscles of respiration.  CARDIOVASCULAR: S1, S2 normal. No murmurs, rubs, or gallops.  ABDOMEN: Soft, nontender, nondistended. Bowel sounds present. No organomegaly or mass.  EXTREMITIES: No pedal edema, cyanosis, or clubbing.   NEUROLOGIC: Cranial nerves II through XII are intact. Muscle strength 4/5 in all extremities. Sensation intact. Gait not checked.  PSYCHIATRIC: The patient is alert and oriented x 3.  SKIN: No obvious rash, lesion, or ulcer.   Physical Exam LABORATORY PANEL:   CBC Recent Labs  Lab 07/24/17 0409  WBC 6.2  HGB 12.3*  HCT 35.2*  PLT 198   ------------------------------------------------------------------------------------------------------------------  Chemistries  Recent Labs  Lab 07/23/17 0817  07/24/17 1157  NA 134*   < > 132*  K 4.7   < > 3.8  CL 102   < > 103  CO2 25   < > 24  GLUCOSE 121*   < > 134*  BUN 30*   < > 23*  CREATININE 1.56*   < > 1.46*  CALCIUM 9.2   < > 8.7*  AST 21  --   --   ALT 11*  --   --   ALKPHOS 52  --   --   BILITOT 1.0  --   --    < > = values in this interval not displayed.   ------------------------------------------------------------------------------------------------------------------  Cardiac Enzymes Recent Labs  Lab 07/23/17 0817  TROPONINI <0.03   ------------------------------------------------------------------------------------------------------------------  RADIOLOGY:  Ct Head Wo Contrast  Result Date: 07/23/2017 CLINICAL DATA:  Dizziness and syncope.  Fall EXAM: CT HEAD WITHOUT CONTRAST TECHNIQUE: Contiguous axial images were obtained from the base of the skull through the vertex without intravenous contrast. COMPARISON:  None. FINDINGS: Brain: There is age related volume loss. There is no intracranial mass, hemorrhage, extra-axial fluid collection, or midline shift. There is mild small vessel disease in the centra semiovale bilaterally. Elsewhere  gray-white compartments appear normal. No evident acute infarct. Vascular: No hyperdense vessels. There is minimal calcification in each carotid siphon. Skull: Bony calvarium appears intact. Sinuses/Orbits: There is mucosal thickening in several ethmoid air cells. Other visualized  paranasal sinuses are clear. Visualized orbits appear symmetric bilaterally. Other: Visualized mastoid air cells are clear. IMPRESSION: Age related volume loss with slight periventricular small vessel disease. No acute infarct. No mass or hemorrhage. Minimal arterial vascular calcification noted. Mucosal thickening noted in several ethmoid air cells. Electronically Signed   By: Lowella Grip III M.D.   On: 07/23/2017 09:17   Ct Chest W Contrast  Result Date: 07/24/2017 CLINICAL DATA:  Colon cancer.  Diarrhea and weakness. EXAM: CT CHEST, ABDOMEN, AND PELVIS WITH CONTRAST TECHNIQUE: Multidetector CT imaging of the chest, abdomen and pelvis was performed following the standard protocol during bolus administration of intravenous contrast. CONTRAST:  33mL OMNIPAQUE IOHEXOL 300 MG/ML  SOLN COMPARISON:  None. FINDINGS: CT CHEST FINDINGS Cardiovascular: Coronary artery calcification and aortic atherosclerotic calcification. Mediastinum/Nodes: No axillary supraclavicular adenopathy. No mediastinal adenopathy. No pericardial effusion. Mild thickening of the distal esophagus above the GE junction. No mass lesion. Lungs/Pleura: Mild nodular reticular pattern in the medial aspect of the RIGHT middle lobe suggesting chronic infection. No suspicious pulmonary nodules. Airways normal. No bronchiectasis Musculoskeletal: No aggressive osseous lesion. CT ABDOMEN AND PELVIS FINDINGS Hepatobiliary: No focal hepatic lesion.  Gallbladder normal Pancreas: Pancreas is normal. No ductal dilatation. No pancreatic inflammation. Spleen: Normal spleen Adrenals/urinary tract: Adrenal glands are normal. Kidneys, ureters and bladder normal Stomach/Bowel: Stomach normal. No small bowel dilatation. Mild no mesenteric inflammation/stranding in central loops small bowel the lower abdomen (image 87/2. Terminal ileum is normal. Ascending, transverse and descending colon normal. Rectosigmoid colon normal. Vascular/Lymphatic: Abdominal aorta is  normal caliber with atherosclerotic calcification. There is no retroperitoneal or periportal lymphadenopathy. No pelvic lymphadenopathy. Reproductive: Prostate normal Other: Trace free fluid the pelvis Musculoskeletal: No aggressive osseous lesion. IMPRESSION: Chest Impression: 1. No acute cardiopulmonary findings. 2. 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 Abdomen / Pelvis Impression: 1. Mild mesenteric inflammation along loops small bowel in the mid abdomen. Findings suggest mild enteritis. No obstruction present. 2.  Aortic Atherosclerosis (ICD10-I70.0). Electronically Signed   By: Suzy Bouchard M.D.   On: 07/24/2017 15:46   Ct Abdomen Pelvis W Contrast  Result Date: 07/24/2017 CLINICAL DATA:  Colon cancer.  Diarrhea and weakness. EXAM: CT CHEST, ABDOMEN, AND PELVIS WITH CONTRAST TECHNIQUE: Multidetector CT imaging of the chest, abdomen and pelvis was performed following the standard protocol during bolus administration of intravenous contrast. CONTRAST:  65mL OMNIPAQUE IOHEXOL 300 MG/ML  SOLN COMPARISON:  None. FINDINGS: CT CHEST FINDINGS Cardiovascular: Coronary artery calcification and aortic atherosclerotic calcification. Mediastinum/Nodes: No axillary supraclavicular adenopathy. No mediastinal adenopathy. No pericardial effusion. Mild thickening of the distal esophagus above the GE junction. No mass lesion. Lungs/Pleura: Mild nodular reticular pattern in the medial aspect of the RIGHT middle lobe suggesting chronic infection. No suspicious pulmonary nodules. Airways normal. No bronchiectasis Musculoskeletal: No aggressive osseous lesion. CT ABDOMEN AND PELVIS FINDINGS Hepatobiliary: No focal hepatic lesion.  Gallbladder normal Pancreas: Pancreas is normal. No ductal dilatation. No pancreatic inflammation. Spleen: Normal spleen Adrenals/urinary tract: Adrenal glands are normal. Kidneys, ureters and bladder normal Stomach/Bowel: Stomach  normal. No small bowel dilatation. Mild no mesenteric inflammation/stranding in central loops small bowel the lower abdomen (image 87/2. Terminal ileum is normal. Ascending, transverse and descending colon normal. Rectosigmoid colon normal. Vascular/Lymphatic: Abdominal aorta  is normal caliber with atherosclerotic calcification. There is no retroperitoneal or periportal lymphadenopathy. No pelvic lymphadenopathy. Reproductive: Prostate normal Other: Trace free fluid the pelvis Musculoskeletal: No aggressive osseous lesion. IMPRESSION: Chest Impression: 1. No acute cardiopulmonary findings. 2. 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 Abdomen / Pelvis Impression: 1. Mild mesenteric inflammation along loops small bowel in the mid abdomen. Findings suggest mild enteritis. No obstruction present. 2.  Aortic Atherosclerosis (ICD10-I70.0). Electronically Signed   By: Suzy Bouchard M.D.   On: 07/24/2017 15:46    ASSESSMENT AND PLAN:   Active Problems:   ARF (acute renal failure) (HCC)  Krosby Grant  is a 82 y.o. male with a known history of hypertension, non-insulin-dependent diabetes mellitus, hypothyroidism presents to hospital secondary to dizziness and syncopal episode.  1. ARF-prerenal causes, dehydration -IV fluids.  Hold metformin and lisinopril for now -Monitor renal function - slight improvement.  2.  Orthostatic hypotension-secondary to poor p.o. intake and dehydration -Hold blood pressure medications.  IV fluids and check orthostatics.  3.  Hypothyroidism-continue Synthroid, checked TSH- normal.  4.  Generalized weakness-be secondary to dehydration.  However weight loss and joint pains for over a month.  - ordered CT with contrast on chest, abdomen, pelvis. - Also ordered blood cultures and echocardiogram as he is complaining of chills and feeling short of breath with minimal exertion. - ANA panel ordered. Checked TSH. -  Physical therapy consulted.  5.  Diabetes mellitus-Metformin on hold. Sliding scale insulin.  Follow-up A1c  6.  DVT prophylaxis-Lovenox.   All the records are reviewed and case discussed with Care Management/Social Workerr. Management plans discussed with the patient, family and they are in agreement.  CODE STATUS: Full.  TOTAL TIME TAKING CARE OF THIS PATIENT: 35 minutes.     POSSIBLE D/C IN 1-2 DAYS, DEPENDING ON CLINICAL CONDITION.   Vaughan Basta M.D on 07/24/2017   Between 7am to 6pm - Pager - [phone]  After 6pm go to [email or web address] - password [email or web address] [facility]  Office  [phone]  CC: Primary care physician; System, Pcp Not In  Note: This dictation was prepared with Dragon dictation along with smaller phrase technology. Any transcriptional errors that result from this process are unintentional.

## 2017-07-24 NOTE — Progress Notes (Signed)
Initial Nutrition Assessment  DOCUMENTATION CODES:   Not applicable  INTERVENTION:   Ensure Enlive po BID, each supplement provides 350 kcal and 20 grams of protein  MVI daily  Liberalize diet   NUTRITION DIAGNOSIS:   Inadequate oral intake related to acute illness as evidenced by per patient/family report  GOAL:   Patient will meet greater than or equal to 90% of their needs  MONITOR:   PO intake, Supplement acceptance, Labs, Weight trends, I & O's  REASON FOR ASSESSMENT:   Malnutrition Screening Tool    ASSESSMENT:   82 y.o. male with a history of diabetes, hypertension, hypothyroidism who presents for evaluation of a syncopal event.   Met with pt in room today. Pt reports decreased appetite and oral intake for the past month. Pt actively vomiting at time of RD visit today. Pt eating only sips/bites today. Pt reports 5lb weight loss over the past months; there is no weight history in chart to confirm. Pt reports several days of diarrhea pta but reports this ended Sunday. Pt s/p CT scan 4/12 noted to have mild enteritis and swelling at the GE junction. RD will liberalize diet and order supplements. Pt does not drink supplements at home but is willing to try chocolate Ensure. Pt takes daily MVI at home. Pt noted to have joint pain in chart, pt reports this is a chronic issue.    Medications reviewed and include: lovenox, insulin, synthroid, NaCl _0 /hr, morphine, zofran, oxycodone   Labs reviewed: Na 132(L), BUN 23(H), creat 1.46(H), Ca 8.7(L) cbgs- 121, 114, 134 x 24hrs AIC 5.8(H)- 4/11   NUTRITION - FOCUSED PHYSICAL EXAM:    Most Recent Value  Orbital Region  No depletion  Upper Arm Region  No depletion  Thoracic and Lumbar Region  No depletion  Buccal Region  No depletion  Temple Region  No depletion  Clavicle Bone Region  No depletion  Clavicle and Acromion Bone Region  No depletion  Scapular Bone Region  No depletion  Dorsal Hand  No depletion  Patellar  Region  No depletion  Anterior Thigh Region  No depletion  Posterior Calf Region  No depletion  Edema (RD Assessment)  None  Hair  Reviewed  Eyes  Reviewed  Mouth  Reviewed  Skin  Reviewed  Nails  Reviewed     Diet Order:  Diet regular Room service appropriate? Yes; Fluid consistency: Thin  EDUCATION NEEDS:   No education needs have been identified at this time  Skin:  Skin Assessment: Reviewed RN Assessment  Last BM:  07/19/2017  Height:   Ht Readings from Last 1 Encounters:  07/23/17 _1  (1.676 m)    Weight:   Wt Readings from Last 1 Encounters:  07/23/17 157 lb (71.2 kg)    Ideal Body Weight:  64.54 kg  BMI:  Body mass index is 25.34 kg/m.  Estimated Nutritional Needs:   Kcal:  1650-1950 calories  Protein:  71-86g/day   Fluid:  >1.7L/day   Koleen Distance MS, RD, LDN Pager #[phone] After Hours Pager: [phone]

## 2017-07-24 NOTE — Care Management (Signed)
During chart review noticed that the last physician Mr. Leather seen was Jodelle Gross was 2017. Discussed primary care physicians at the bedside. States he last seen Dr, Claudette Stapler last Wednesday. Wife and patient both state that they will be arranging a follow-up appointment with Dr. Harrel Lemon when discharged.  Shelbie Ammons RN MSN CCM Care Management [phone]

## 2017-07-25 LAB — BASIC METABOLIC PANEL
ANION GAP: 7 (ref 5–15)
BUN: 18 mg/dL (ref 6–20)
CALCIUM: 8.2 mg/dL — AB (ref 8.9–10.3)
CO2: 24 mmol/L (ref 22–32)
Chloride: 98 mmol/L — ABNORMAL LOW (ref 101–111)
Creatinine, Ser: 1.45 mg/dL — ABNORMAL HIGH (ref 0.61–1.24)
GFR calc Af Amer: 51 mL/min — ABNORMAL LOW (ref >60)
GFR, EST NON AFRICAN AMERICAN: 44 mL/min — AB (ref >60)
Glucose, Bld: 137 mg/dL — ABNORMAL HIGH (ref 65–99)
Potassium: 4.2 mmol/L (ref 3.5–5.1)
SODIUM: 129 mmol/L — AB (ref 135–145)

## 2017-07-25 LAB — GLUCOSE, CAPILLARY
Glucose-Capillary: 108 mg/dL — ABNORMAL HIGH (ref 65–99)
Glucose-Capillary: 122 mg/dL — ABNORMAL HIGH (ref 65–99)
Glucose-Capillary: 115 mg/dL — ABNORMAL HIGH (ref 65–99)
Glucose-Capillary: 131 mg/dL — ABNORMAL HIGH (ref 65–99)

## 2017-07-25 MED ORDER — PANTOPRAZOLE SODIUM 40 MG PO TBEC
40.0000 mg | DELAYED_RELEASE_TABLET | Freq: Every day | ORAL | Status: DC
  Administered 2017-07-25 – 2017-07-28 (×4): 40 mg via ORAL
  Filled 2017-07-25 (×4): qty 1

## 2017-07-25 MED ORDER — MORPHINE SULFATE 15 MG PO TABS
15.0000 mg | ORAL_TABLET | ORAL | Status: DC | PRN
  Administered 2017-07-25 (×3): 15 mg via ORAL
  Filled 2017-07-25 (×3): qty 1

## 2017-07-25 MED ORDER — IBUPROFEN 400 MG PO TABS
400.0000 mg | ORAL_TABLET | Freq: Two times a day (BID) | ORAL | Status: DC
  Administered 2017-07-25: 400 mg via ORAL
  Filled 2017-07-25: qty 1

## 2017-07-25 MED ORDER — SODIUM CHLORIDE 0.9 % IV SOLN
INTRAVENOUS | Status: DC
  Administered 2017-07-25: 02:00:00 via INTRAVENOUS

## 2017-07-25 MED ORDER — SODIUM CHLORIDE 0.9 % IV SOLN
INTRAVENOUS | Status: DC
  Administered 2017-07-25 – 2017-07-28 (×6): via INTRAVENOUS

## 2017-07-25 NOTE — Progress Notes (Signed)
Patient complaining of arthritic pain and asking for pain medication. Educated patient and family regarding IV morphine, which was requested. MD called and discussed need for arthritic pain medication. Patient also stating that ordered oxy makes him extremely nauseous. MD to see patient and place orders. Madlyn Frankel, RN

## 2017-07-25 NOTE — Progress Notes (Signed)
Patient and family at bedside educated regarding need of measuring urine output. Patient prefers family to assist with urinal. Family to write output amount on whiteboard for nursing to document.  Madlyn Frankel, RN

## 2017-07-25 NOTE — Progress Notes (Addendum)
[place] [facility] [employer] NAME: Richard Acosta    MR#:  [id number]  DATE OF BIRTH:  Nov 23, 1935  SUBJECTIVE:  CHIEF COMPLAINT:   Chief Complaint  Patient presents with  . Weakness    came with weakness, fatigue, shortness of breath with minimal exertion, pain in all the joints.  No fever but have some chills.  Some better today.    REVIEW OF SYSTEMS:  CONSTITUTIONAL: No fever, he have fatigue or weakness.  EYES: No blurred or double vision.  EARS, NOSE, AND THROAT: No tinnitus or ear pain.  RESPIRATORY: No cough, shortness of breath, wheezing or hemoptysis.  CARDIOVASCULAR: No chest pain, orthopnea, edema.  GASTROINTESTINAL: No nausea, vomiting, diarrhea or abdominal pain.  GENITOURINARY: No dysuria, hematuria.  ENDOCRINE: No polyuria, nocturia,  HEMATOLOGY: No anemia, easy bruising or bleeding SKIN: No rash or lesion. MUSCULOSKELETAL: No joint pain or arthritis.   NEUROLOGIC: No tingling, numbness, weakness.  PSYCHIATRY: No anxiety or depression.   ROS  DRUG ALLERGIES:  No Known Allergies  VITALS:  Blood pressure 110/60, pulse 66, temperature 97.9 F (36.6 C), temperature source Oral, resp. rate 17, height 5\' 6"  (1.676 m), weight 71.2 kg (157 lb), SpO2 99 %.  PHYSICAL EXAMINATION:  GENERAL:  82 y.o.-year-old patient lying in the bed with no acute distress.  EYES: Pupils equal, round, reactive to light and accommodation. No scleral icterus. Extraocular muscles intact.  HEENT: Head atraumatic, normocephalic. Oropharynx and nasopharynx clear.  NECK:  Supple, no jugular venous distention. No thyroid enlargement, no tenderness.  LUNGS: Normal breath sounds bilaterally, no wheezing, rales,rhonchi or crepitation. No use of accessory muscles of respiration.  CARDIOVASCULAR: S1, S2 normal. No murmurs, rubs, or gallops.  ABDOMEN: Soft, nontender, nondistended. Bowel sounds present. No organomegaly or mass.  EXTREMITIES: No pedal edema,  cyanosis, or clubbing.  NEUROLOGIC: Cranial nerves II through XII are intact. Muscle strength 4/5 in all extremities. Sensation intact. Gait not checked.  PSYCHIATRIC: The patient is alert and oriented x 3.  SKIN: No obvious rash, lesion, or ulcer.   Physical Exam LABORATORY PANEL:   CBC Recent Labs  Lab 07/24/17 0409  WBC 6.2  HGB 12.3*  HCT 35.2*  PLT 198   ------------------------------------------------------------------------------------------------------------------  Chemistries  Recent Labs  Lab 07/23/17 0817  07/25/17 0343  NA 134*   < > 129*  K 4.7   < > 4.2  CL 102   < > 98*  CO2 25   < > 24  GLUCOSE 121*   < > 137*  BUN 30*   < > 18  CREATININE 1.56*   < > 1.45*  CALCIUM 9.2   < > 8.2*  AST 21  --   --   ALT 11*  --   --   ALKPHOS 52  --   --   BILITOT 1.0  --   --    < > = values in this interval not displayed.   ------------------------------------------------------------------------------------------------------------------  Cardiac Enzymes Recent Labs  Lab 07/23/17 0817  TROPONINI <0.03   ------------------------------------------------------------------------------------------------------------------  RADIOLOGY:  Ct Chest W Contrast  Result Date: 07/24/2017 CLINICAL DATA:  Colon cancer.  Diarrhea and weakness. EXAM: CT CHEST, ABDOMEN, AND PELVIS WITH CONTRAST TECHNIQUE: Multidetector CT imaging of the chest, abdomen and pelvis was performed following the standard protocol during bolus administration of intravenous contrast. CONTRAST:  16mL OMNIPAQUE IOHEXOL 300 MG/ML  SOLN COMPARISON:  None. FINDINGS: CT CHEST FINDINGS Cardiovascular: Coronary artery calcification and aortic atherosclerotic calcification.  Mediastinum/Nodes: No axillary supraclavicular adenopathy. No mediastinal adenopathy. No pericardial effusion. Mild thickening of the distal esophagus above the GE junction. No mass lesion. Lungs/Pleura: Mild nodular reticular pattern in the medial  aspect of the RIGHT middle lobe suggesting chronic infection. No suspicious pulmonary nodules. Airways normal. No bronchiectasis Musculoskeletal: No aggressive osseous lesion. CT ABDOMEN AND PELVIS FINDINGS Hepatobiliary: No focal hepatic lesion.  Gallbladder normal Pancreas: Pancreas is normal. No ductal dilatation. No pancreatic inflammation. Spleen: Normal spleen Adrenals/urinary tract: Adrenal glands are normal. Kidneys, ureters and bladder normal Stomach/Bowel: Stomach normal. No small bowel dilatation. Mild no mesenteric inflammation/stranding in central loops small bowel the lower abdomen (image 87/2. Terminal ileum is normal. Ascending, transverse and descending colon normal. Rectosigmoid colon normal. Vascular/Lymphatic: Abdominal aorta is normal caliber with atherosclerotic calcification. There is no retroperitoneal or periportal lymphadenopathy. No pelvic lymphadenopathy. Reproductive: Prostate normal Other: Trace free fluid the pelvis Musculoskeletal: No aggressive osseous lesion. IMPRESSION: Chest Impression: 1. No acute cardiopulmonary findings. 2. 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 Abdomen / Pelvis Impression: 1. Mild mesenteric inflammation along loops small bowel in the mid abdomen. Findings suggest mild enteritis. No obstruction present. 2.  Aortic Atherosclerosis (ICD10-I70.0). Electronically Signed   By: Suzy Bouchard M.D.   On: 07/24/2017 15:46   Ct Abdomen Pelvis W Contrast  Result Date: 07/24/2017 CLINICAL DATA:  Colon cancer.  Diarrhea and weakness. EXAM: CT CHEST, ABDOMEN, AND PELVIS WITH CONTRAST TECHNIQUE: Multidetector CT imaging of the chest, abdomen and pelvis was performed following the standard protocol during bolus administration of intravenous contrast. CONTRAST:  56mL OMNIPAQUE IOHEXOL 300 MG/ML  SOLN COMPARISON:  None. FINDINGS: CT CHEST FINDINGS Cardiovascular: Coronary artery calcification and aortic  atherosclerotic calcification. Mediastinum/Nodes: No axillary supraclavicular adenopathy. No mediastinal adenopathy. No pericardial effusion. Mild thickening of the distal esophagus above the GE junction. No mass lesion. Lungs/Pleura: Mild nodular reticular pattern in the medial aspect of the RIGHT middle lobe suggesting chronic infection. No suspicious pulmonary nodules. Airways normal. No bronchiectasis Musculoskeletal: No aggressive osseous lesion. CT ABDOMEN AND PELVIS FINDINGS Hepatobiliary: No focal hepatic lesion.  Gallbladder normal Pancreas: Pancreas is normal. No ductal dilatation. No pancreatic inflammation. Spleen: Normal spleen Adrenals/urinary tract: Adrenal glands are normal. Kidneys, ureters and bladder normal Stomach/Bowel: Stomach normal. No small bowel dilatation. Mild no mesenteric inflammation/stranding in central loops small bowel the lower abdomen (image 87/2. Terminal ileum is normal. Ascending, transverse and descending colon normal. Rectosigmoid colon normal. Vascular/Lymphatic: Abdominal aorta is normal caliber with atherosclerotic calcification. There is no retroperitoneal or periportal lymphadenopathy. No pelvic lymphadenopathy. Reproductive: Prostate normal Other: Trace free fluid the pelvis Musculoskeletal: No aggressive osseous lesion. IMPRESSION: Chest Impression: 1. No acute cardiopulmonary findings. 2. 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 Abdomen / Pelvis Impression: 1. Mild mesenteric inflammation along loops small bowel in the mid abdomen. Findings suggest mild enteritis. No obstruction present. 2.  Aortic Atherosclerosis (ICD10-I70.0). Electronically Signed   By: Suzy Bouchard M.D.   On: 07/24/2017 15:46    ASSESSMENT AND PLAN:   Active Problems:   ARF (acute renal failure) (HCC)  Richard Acosta  is a 82 y.o. male with a known history of hypertension, non-insulin-dependent diabetes mellitus, hypothyroidism  presents to hospital secondary to dizziness and syncopal episode.  1. ARF-prerenal causes, dehydration -IV fluids.  Hold metformin and lisinopril for now -Monitor renal function - slight improvement.  2.  Orthostatic hypotension-secondary to poor p.o. intake and dehydration -Hold  blood pressure medications.  IV fluids and check orthostatics.  3.  Hypothyroidism-continue Synthroid, checked TSH- normal.  4. Enteritis    Cipro+ Flagyl IV.  5. Atypical pneumonia, MAI?   Cipro for now, Pulm consult.  6. Generalized weakness-be secondary to dehydration.  However weight loss and joint pains for over a month.  - ordered CT with contrast on chest, abdomen, pelvis. Enteritis+ Possible MAI, - Also ordered blood cultures and echocardiogram as he is complaining of chills and feeling short of breath with minimal exertion.- Bl cx negative so far, Echo- no abnormalities. - ANA panel ordered. Checked TSH. - Physical therapy consulted.  7.  Diabetes mellitus-Metformin on hold. Sliding scale insulin.  Follow-up A1c  8.  DVT prophylaxis-Lovenox.  9. Arthritis   Advised to take scheduled ibuprofen 2 times a day with PPI, follow up in orthopedic clinic  All the records are reviewed and case discussed with Care Management/Social Workerr. Management plans discussed with the patient, family and they are in agreement.  CODE STATUS: Full.  TOTAL TIME TAKING CARE OF THIS PATIENT: 35 minutes.    POSSIBLE D/C IN 1-2 DAYS, DEPENDING ON CLINICAL CONDITION.   Vaughan Basta M.D on 07/25/2017   Between 7am to 6pm - Pager - [phone]  After 6pm go to [email or web address] - password [email or web address] [facility]  Office  [phone]  CC: Primary care physician; System, Pcp Not In  Note: This dictation was prepared with Dragon dictation along with smaller phrase technology. Any transcriptional errors that result from this process are unintentional.

## 2017-07-25 NOTE — Progress Notes (Signed)
Date: 07/25/2017,   MRN# [id number] Richard Acosta February 13, 1936 Code Status:     Code Status Orders  (From admission, onward)        Start     Ordered   07/23/17 1300  Full code  Continuous     07/23/17 1259    Code Status History    This patient has a current code status but no historical code status.     Hosp day:@LENGTHOFSTAYDAYS @ Referring MD: @ATDPROV @      CC: ? MAI   HPI: This is a 82 year old male.  Asked to see regarding chest ct report. ?? MAI. Off no bronchiectasis was noted.    FINDINGS: CT CHEST FINDINGS  Cardiovascular: Coronary artery calcification and aortic atherosclerotic calcification.  Mediastinum/Nodes: No axillary supraclavicular adenopathy. No mediastinal adenopathy. No pericardial effusion. Mild thickening of the distal esophagus above the GE junction. No mass lesion.  Lungs/Pleura: Mild nodular reticular pattern in the medial aspect of the RIGHT middle lobe suggesting chronic infection. No suspicious pulmonary nodules. Airways normal. No bronchiectasis  Musculoskeletal: No aggressive osseous lesion.  CT ABDOMEN AND PELVIS FINDINGS  Hepatobiliary: No focal hepatic lesion.  Gallbladder normal  Pancreas: Pancreas is normal. No ductal dilatation. No pancreatic inflammation.  Spleen: Normal spleen  Adrenals/urinary tract: Adrenal glands are normal. Kidneys, ureters and bladder normal  Stomach/Bowel: Stomach normal. No small bowel dilatation. Mild no mesenteric inflammation/stranding in central loops small bowel the lower abdomen (image 87/2. Terminal ileum is normal. Ascending, transverse and descending colon normal. Rectosigmoid colon normal.  Vascular/Lymphatic: Abdominal aorta is normal caliber with atherosclerotic calcification. There is no retroperitoneal or periportal lymphadenopathy. No pelvic lymphadenopathy.  Reproductive: Prostate normal  Other: Trace free fluid the pelvis  Musculoskeletal: No aggressive  osseous lesion.  IMPRESSION: Chest Impression:  1. No acute cardiopulmonary findings. 2. 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  Abdomen / Pelvis Impression:  1. Mild mesenteric inflammation along loops small bowel in the mid abdomen. Findings suggest mild enteritis. No obstruction present. 2.  Aortic Atherosclerosis (ICD10-I70.0).   Electronically Signed   By: Suzy Bouchard M.D.   On: 07/24/2017 15:46  PMHX:   Past Medical History:  Diagnosis Date  . Diabetes mellitus without complication ([place])   . Hyperlipidemia   . Hypertension   . Hypothyroidism    Surgical Hx:  Past Surgical History:  Procedure Laterality Date  . APPENDECTOMY     Family Hx:  Family History  Problem Relation Age of Onset  . Diabetes Mother   . Diabetes Father    Social Hx:   Social History   Tobacco Use  . Smoking status: Former Research scientist (life sciences)  . Smokeless tobacco: Never Used  . Tobacco comment: quit about 50 years ago  Substance Use Topics  . Alcohol use: Not Currently    Alcohol/week: 1.2 oz    Types: 2 Glasses of wine per week    Frequency: Never  . Drug use: Not Currently   Medication:    Home Medication:    Current Medication: @CURMEDTAB @   Allergies:  Patient has no known allergies.  Review of Systems: Gen:  Denies  fever, sweats, chills HEENT: Denies blurred vision, double vision, ear pain, eye pain, hearing loss, nose bleeds, sore throat Cvc:  No dizziness, chest pain or heaviness Resp:  + sob,  + weak Gi: Denies swallowing difficulty, stomach pain, nausea or vomiting, diarrhea, constipation, bowel incontinence Gu:  Denies bladder incontinence, burning urine Ext:   +  ve Joint pain, stiffness or swelling Skin: No skin rash, easy bruising or bleeding or hives Endoc:  No polyuria, polydipsia , polyphagia or weight change Psych: No depression, insomnia or hallucinations  Other:  All other systems  negative  Physical Examination:   VS: BP 118/63 (BP Location: Right Arm)   Pulse 73   Temp 98.4 F (36.9 C) (Oral)   Resp 12   Ht 5\' 6"  (1.676 m)   Wt 71.2 kg (157 lb)   SpO2 100%   BMI 25.34 kg/m   General Appearance: No distress,friall Neuro: without focal findings, mental status, speech normal, alert and oriented, cranial nerves 2-12 intact, reflexes normal and symmetric, sensation grossly normal  HEENT: PERRLA, EOM intact, no ptosis, no other lesions noticed, NECK: Supple, no stridor  Pulmonary:.No wheezing, No rales  Sputum Production:   Cardiovascular:  Normal S1,S2.  No m/r/g. .    Abdomen:Benign, Soft, non-tender, No masses, hepatosplenomegaly, No lymphadenopathy Endoc: No evident thyromegaly, no signs of acromegaly or Cushing features Skin:   warm, no rashes, no ecchymosis  Extremities: normal, no cyanosis, clubbing, no edema, warm with normal capillary refill.   Labs results:   Recent Labs    07/23/17 0817 07/24/17 0409 07/24/17 1157 07/25/17 0343  HGB 12.7* 12.3*  --   --   HCT 36.1* 35.2*  --   --   MCV 85.8 87.0  --   --   WBC 8.2 6.2  --   --   BUN 30* 26* 23* 18  CREATININE 1.56* 1.56* 1.46* 1.45*  GLUCOSE 121* 114* 134* 137*  CALCIUM 9.2 8.6* 8.7* 8.2*  ,  ds DNA Ab 0 - 9 IU/mL <1   Comment: (NOTE)                   Negative   <5                   Equivocal 5 - 9                   Positive   >9   Ribonucleic Protein 0.0 - 0.9 AI 0.9   ENA SM Ab Ser-aCnc 0.0 - 0.9 AI <0.2   Scleroderma (Scl-70) (ENA) Antibody, IgG 0.0 - 0.9 AI <0.2   SSA (Ro) (ENA) Antibody, IgG 0.0 - 0.9 AI <0.2   SSB (La) (ENA) Antibody, IgG 0.0 - 0.9 AI <0.2   Chromatin Ab SerPl-aCnc 0.0 - 0.9 AI <0.2   Anti JO-1 0.0 - 0.9 AI <0.2   Centromere Ab Screen 0.0 - 0.9 AI <0.2   See below:  Comment   Comment: (NOTE)       Assessment and Plan: Ask to see regarding the possibility of MAI base on a chest ct finding.  ( Mild  nodular reticular pattern in the medial aspect of the RIGHT middle lobe suggesting chronic infection. No suspicious pulmonary nodules. Airways normal. No bronchiectasis).howver the could be an aspiration pneumonia or process. empericall treat for aspiration ( ie continue flagil, cipro), speech eval. Increase nutrition. Out patient f/u in pulmonmary  To determine treatment is indicated we need two sputum  That are + ve for MAI or one + culture from bronchoscopy.   At present do not think bronch is warranted  sputum c/s and afb ( looking MAI)   Hx of renal insufficiency, most likely due to hypoperfusion. But with fever, lung changes on cxr and joint pain Will check anca in addition to the other  serologies     I have personally obtained a history, examined the patient, evaluated laboratory and imaging results, formulated the assessment and plan and placed orders.  The Patient requires high complexity decision making for assessment and support, frequent evaluation and titration of therapies, application of advanced monitoring technologies and extensive interpretation of multiple databases.   Herbon Fleming,M.D. Pulmonary Medicine [facility]

## 2017-07-26 LAB — CBC
HEMATOCRIT: 32 % — AB (ref 40.0–52.0)
HEMOGLOBIN: 11.2 g/dL — AB (ref 13.0–18.0)
MCH: 30.4 pg (ref 26.0–34.0)
MCHC: 35.1 g/dL (ref 32.0–36.0)
MCV: 86.6 fL (ref 80.0–100.0)
Platelets: 191 10*3/uL (ref 150–440)
RBC: 3.7 MIL/uL — AB (ref 4.40–5.90)
RDW: 13.1 % (ref 11.5–14.5)
WBC: 8.3 10*3/uL (ref 3.8–10.6)

## 2017-07-26 LAB — BASIC METABOLIC PANEL
Anion gap: 5 (ref 5–15)
BUN: 19 mg/dL (ref 6–20)
CHLORIDE: 97 mmol/L — AB (ref 101–111)
CO2: 24 mmol/L (ref 22–32)
CREATININE: 1.76 mg/dL — AB (ref 0.61–1.24)
Calcium: 7.8 mg/dL — ABNORMAL LOW (ref 8.9–10.3)
GFR calc Af Amer: 41 mL/min — ABNORMAL LOW (ref >60)
GFR calc non Af Amer: 35 mL/min — ABNORMAL LOW (ref >60)
Glucose, Bld: 121 mg/dL — ABNORMAL HIGH (ref 65–99)
POTASSIUM: 4.1 mmol/L (ref 3.5–5.1)
Sodium: 126 mmol/L — ABNORMAL LOW (ref 135–145)

## 2017-07-26 LAB — GLUCOSE, CAPILLARY
GLUCOSE-CAPILLARY: 101 mg/dL — AB (ref 65–99)
Glucose-Capillary: 118 mg/dL — ABNORMAL HIGH (ref 65–99)
Glucose-Capillary: 175 mg/dL — ABNORMAL HIGH (ref 65–99)
Glucose-Capillary: 131 mg/dL — ABNORMAL HIGH (ref 65–99)

## 2017-07-26 LAB — EXPECTORATED SPUTUM ASSESSMENT W REFEX TO RESP CULTURE

## 2017-07-26 LAB — EXPECTORATED SPUTUM ASSESSMENT W GRAM STAIN, RFLX TO RESP C: Special Requests: NORMAL

## 2017-07-26 MED ORDER — MORPHINE SULFATE 15 MG PO TABS
15.0000 mg | ORAL_TABLET | Freq: Two times a day (BID) | ORAL | Status: DC | PRN
  Administered 2017-07-26 – 2017-07-28 (×4): 15 mg via ORAL
  Filled 2017-07-26 (×4): qty 1

## 2017-07-26 MED ORDER — CIPROFLOXACIN HCL 500 MG PO TABS
500.0000 mg | ORAL_TABLET | Freq: Two times a day (BID) | ORAL | Status: DC
  Administered 2017-07-26 – 2017-07-28 (×5): 500 mg via ORAL
  Filled 2017-07-26 (×5): qty 1

## 2017-07-26 MED ORDER — METRONIDAZOLE 500 MG PO TABS
500.0000 mg | ORAL_TABLET | Freq: Three times a day (TID) | ORAL | Status: DC
  Administered 2017-07-26 – 2017-07-28 (×7): 500 mg via ORAL
  Filled 2017-07-26 (×7): qty 1

## 2017-07-26 NOTE — Progress Notes (Signed)
Date: 07/26/2017,   MRN# [id number] Richard Acosta 1935/07/14 Code Status:     Code Status Orders  (From admission, onward)        Start     Ordered   07/23/17 1300  Full code  Continuous     07/23/17 1259    Code Status History    This patient has a current code status but no historical code status.     Hosp day:@LENGTHOFSTAYDAYS @ Referring MD: @ATDPROV @      HPI: He looks and feel much better. Less orthostatic. No nausea.  He mentioned he is unable to walk without assistance. " weak. "  PMHX:   Past Medical History:  Diagnosis Date  . Diabetes mellitus without complication ([place])   . Hyperlipidemia   . Hypertension   . Hypothyroidism    Surgical Hx:  Past Surgical History:  Procedure Laterality Date  . APPENDECTOMY     Family Hx:  Family History  Problem Relation Age of Onset  . Diabetes Mother   . Diabetes Father    Social Hx:   Social History   Tobacco Use  . Smoking status: Former Research scientist (life sciences)  . Smokeless tobacco: Never Used  . Tobacco comment: quit about 50 years ago  Substance Use Topics  . Alcohol use: Not Currently    Alcohol/week: 1.2 oz    Types: 2 Glasses of wine per week    Frequency: Never  . Drug use: Not Currently   Medication:    Home Medication:    Current Medication: @CURMEDTAB @   Allergies:  Patient has no known allergies.  Review of Systems: Gen:  Denies  fever, sweats, chills HEENT: Denies blurred vision, double vision, ear pain, eye pain, hearing loss, nose bleeds, sore throat Cvc:  No dizziness, chest pain or heaviness Resp:   Less sob, no significant sputum or hemoptysis  Gi: Denies swallowing difficulty, stomach pain, nausea or vomiting, diarrhea, constipation, bowel incontinence Gu:  Denies bladder incontinence, burning urine Ext:   No Joint pain, stiffness or swelling Skin: No skin rash, easy bruising or bleeding or hives Endoc:  No polyuria, polydipsia , polyphagia or weight change Psych: No depression, insomnia or  hallucinations  Other:  All other systems negative  Physical Examination:   VS: BP (!) 111/53 (BP Location: Right Arm)   Pulse 92   Temp 98.8 F (37.1 C) (Oral)   Resp 16   Ht 5\' 6"  (1.676 m)   Wt 71.2 kg (157 lb)   SpO2 99%   BMI 25.34 kg/m   General Appearance: No distress  Neuro: without focal findings, mental status, speech normal, alert and oriented, cranial nerves 2-12 intact, reflexes normal and symmetric, sensation grossly normal  HEENT: PERRLA, EOM intact, no ptosis, no other lesions noticed, Mallampati: Pulmonary:.No wheezing, No rales  Sputum Production:   Cardiovascular:  Normal S1,S2.  No m/r/g.  Abdominal aorta pulsation normal.    Abdomen:Benign, Soft, non-tender, No masses, hepatosplenomegaly, No lymphadenopathy Endoc: No evident thyromegaly, no signs of acromegaly or Cushing features Skin:   warm, no rashes, no ecchymosis  Extremities: normal, no cyanosis, clubbing, no edema, warm with normal capillary refill. Other findings:   Labs results:   Recent Labs    07/24/17 0409 07/24/17 1157 07/25/17 0343 07/26/17 0358  HGB 12.3*  --   --  11.2*  HCT 35.2*  --   --  32.0*  MCV 87.0  --   --  86.6  WBC 6.2  --   --  8.3  BUN 26* 23* 18 19  CREATININE 1.56* 1.46* 1.45* 1.76*  GLUCOSE 114* 134* 137* 121*  CALCIUM 8.6* 8.7* 8.2* 7.8*  ,    :   Assessment and Plan: Mild nodular reticular pattern in the medial aspect of the RIGHT middle lobe suggesting chronic infection. No suspicious pulmonary nodules. Airways normal. No bronchiectasis ? MAI. Typically you MAI in patients with bronchiectasis, or immune deficient. Either one does  not appear to be present. One sputum c/s is pending. Will try to get another one Will continue as outlined yesterday     I have personally obtained a history, examined the patient, evaluated laboratory and imaging results, formulated the assessment and plan and placed orders.  The Patient requires high complexity decision  making for assessment and support, frequent evaluation and titration of therapies, application of advanced monitoring technologies and extensive interpretation of multiple databases.   Herbon Fleming,M.D. Pulmonary  Medicine [facility]

## 2017-07-26 NOTE — Progress Notes (Signed)
[facility] NAME: Sandy Blouch    MR#:  [id number]  DATE OF BIRTH:  [birth year]  SUBJECTIVE:  CHIEF COMPLAINT:   Chief Complaint  Patient presents with  . Weakness    came with weakness, fatigue, shortness of breath with minimal exertion, pain in all the joints.  No fever but have some chills.  Some better today.    REVIEW OF SYSTEMS:  CONSTITUTIONAL: No fever, he have fatigue or weakness.  EYES: No blurred or double vision.  EARS, NOSE, AND THROAT: No tinnitus or ear pain.  RESPIRATORY: No cough, shortness of breath, wheezing or hemoptysis.  CARDIOVASCULAR: No chest pain, orthopnea, edema.  GASTROINTESTINAL: No nausea, vomiting, diarrhea or abdominal pain.  GENITOURINARY: No dysuria, hematuria.  ENDOCRINE: No polyuria, nocturia,  HEMATOLOGY: No anemia, easy bruising or bleeding SKIN: No rash or lesion. MUSCULOSKELETAL: No joint pain or arthritis.   NEUROLOGIC: No tingling, numbness, weakness.  PSYCHIATRY: No anxiety or depression.   Review of Systems  Constitutional: Negative for chills, fever and weight loss.  HENT: Negative for ear discharge, ear pain and nosebleeds.   Eyes: Negative for blurred vision, pain and discharge.  Respiratory: Negative for sputum production, shortness of breath, wheezing and stridor.   Cardiovascular: Negative for chest pain, palpitations, orthopnea and PND.  Gastrointestinal: Negative for abdominal pain, diarrhea, nausea and vomiting.  Genitourinary: Negative for frequency and urgency.  Musculoskeletal: Negative for back pain and joint pain.  Neurological: Negative for sensory change, speech change, focal weakness and weakness.  Psychiatric/Behavioral: Negative for depression and hallucinations. The patient is not nervous/anxious.     DRUG ALLERGIES:  No Known Allergies  VITALS:  Blood pressure 108/66, pulse 67, temperature 97.9 F (36.6 C), temperature source Oral, resp. rate 15, height  5\' 6"  (1.676 m), weight 71.2 kg (157 lb), SpO2 99 %.  PHYSICAL EXAMINATION:  GENERAL:  82 y.o.-year-old patient lying in the bed with no acute distress.  EYES: Pupils equal, round, reactive to light and accommodation. No scleral icterus. Extraocular muscles intact.  HEENT: Head atraumatic, normocephalic. Oropharynx and nasopharynx clear.  NECK:  Supple, no jugular venous distention. No thyroid enlargement, no tenderness.  LUNGS: Normal breath sounds bilaterally, no wheezing, rales,rhonchi or crepitation. No use of accessory muscles of respiration.  CARDIOVASCULAR: S1, S2 normal. No murmurs, rubs, or gallops.  ABDOMEN: Soft, nontender, nondistended. Bowel sounds present. No organomegaly or mass. Small blister on left flank, clear fluid EXTREMITIES: No pedal edema, cyanosis, or clubbing.  NEUROLOGIC: Cranial nerves II through XII are intact. Muscle strength 4/5 in all extremities. Sensation intact. Gait not checked.  PSYCHIATRIC: patient is alert and oriented x 3.  SKIN: No obvious rash, lesion, or ulcer.   Physical Exam LABORATORY PANEL:   CBC Recent Labs  Lab 07/26/17 0358  WBC 8.3  HGB 11.2*  HCT 32.0*  PLT 191   ------------------------------------------------------------------------------------------------------------------  Chemistries  Recent Labs  Lab 07/23/17 0817  07/26/17 0358  NA 134*   < > 126*  K 4.7   < > 4.1  CL 102   < > 97*  CO2 25   < > 24  GLUCOSE 121*   < > 121*  BUN 30*   < > 19  CREATININE 1.56*   < > 1.76*  CALCIUM 9.2   < > 7.8*  AST 21  --   --   ALT 11*  --   --   ALKPHOS 52  --   --  BILITOT 1.0  --   --    < > = values in this interval not displayed.   ------------------------------------------------------------------------------------------------------------------  Cardiac Enzymes Recent Labs  Lab 07/23/17 0817  TROPONINI <0.03    ------------------------------------------------------------------------------------------------------------------  RADIOLOGY:  Ct Chest W Contrast  Result Date: 07/24/2017 CLINICAL DATA:  Colon cancer.  Diarrhea and weakness. EXAM: CT CHEST, ABDOMEN, AND PELVIS WITH CONTRAST TECHNIQUE: Multidetector CT imaging of the chest, abdomen and pelvis was performed following the standard protocol during bolus administration of intravenous contrast. CONTRAST:  35mL OMNIPAQUE IOHEXOL 300 MG/ML  SOLN COMPARISON:  None. FINDINGS: CT CHEST FINDINGS Cardiovascular: Coronary artery calcification and aortic atherosclerotic calcification. Mediastinum/Nodes: No axillary supraclavicular adenopathy. No mediastinal adenopathy. No pericardial effusion. Mild thickening of the distal esophagus above the GE junction. No mass lesion. Lungs/Pleura: Mild nodular reticular pattern in the medial aspect of the RIGHT middle lobe suggesting chronic infection. No suspicious pulmonary nodules. Airways normal. No bronchiectasis Musculoskeletal: No aggressive osseous lesion. CT ABDOMEN AND PELVIS FINDINGS Hepatobiliary: No focal hepatic lesion.  Gallbladder normal Pancreas: Pancreas is normal. No ductal dilatation. No pancreatic inflammation. Spleen: Normal spleen Adrenals/urinary tract: Adrenal glands are normal. Kidneys, ureters and bladder normal Stomach/Bowel: Stomach normal. No small bowel dilatation. Mild no mesenteric inflammation/stranding in central loops small bowel the lower abdomen (image 87/2. Terminal ileum is normal. Ascending, transverse and descending colon normal. Rectosigmoid colon normal. Vascular/Lymphatic: Abdominal aorta is normal caliber with atherosclerotic calcification. There is no retroperitoneal or periportal lymphadenopathy. No pelvic lymphadenopathy. Reproductive: Prostate normal Other: Trace free fluid the pelvis Musculoskeletal: No aggressive osseous lesion. IMPRESSION: Chest Impression: 1. No acute  cardiopulmonary findings. 2. 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 Abdomen / Pelvis Impression: 1. Mild mesenteric inflammation along loops small bowel in the mid abdomen. Findings suggest mild enteritis. No obstruction present. 2.  Aortic Atherosclerosis (ICD10-I70.0). Electronically Signed   By: Suzy Bouchard M.D.   On: 07/24/2017 15:46   Ct Abdomen Pelvis W Contrast  Result Date: 07/24/2017 CLINICAL DATA:  Colon cancer.  Diarrhea and weakness. EXAM: CT CHEST, ABDOMEN, AND PELVIS WITH CONTRAST TECHNIQUE: Multidetector CT imaging of the chest, abdomen and pelvis was performed following the standard protocol during bolus administration of intravenous contrast. CONTRAST:  28mL OMNIPAQUE IOHEXOL 300 MG/ML  SOLN COMPARISON:  None. FINDINGS: CT CHEST FINDINGS Cardiovascular: Coronary artery calcification and aortic atherosclerotic calcification. Mediastinum/Nodes: No axillary supraclavicular adenopathy. No mediastinal adenopathy. No pericardial effusion. Mild thickening of the distal esophagus above the GE junction. No mass lesion. Lungs/Pleura: Mild nodular reticular pattern in the medial aspect of the RIGHT middle lobe suggesting chronic infection. No suspicious pulmonary nodules. Airways normal. No bronchiectasis Musculoskeletal: No aggressive osseous lesion. CT ABDOMEN AND PELVIS FINDINGS Hepatobiliary: No focal hepatic lesion.  Gallbladder normal Pancreas: Pancreas is normal. No ductal dilatation. No pancreatic inflammation. Spleen: Normal spleen Adrenals/urinary tract: Adrenal glands are normal. Kidneys, ureters and bladder normal Stomach/Bowel: Stomach normal. No small bowel dilatation. Mild no mesenteric inflammation/stranding in central loops small bowel the lower abdomen (image 87/2. Terminal ileum is normal. Ascending, transverse and descending colon normal. Rectosigmoid colon normal. Vascular/Lymphatic: Abdominal aorta is normal caliber  with atherosclerotic calcification. There is no retroperitoneal or periportal lymphadenopathy. No pelvic lymphadenopathy. Reproductive: Prostate normal Other: Trace free fluid the pelvis Musculoskeletal: No aggressive osseous lesion. IMPRESSION: Chest Impression: 1. No acute cardiopulmonary findings. 2. 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 Abdomen / Pelvis Impression: 1. Mild mesenteric inflammation  along loops small bowel in the mid abdomen. Findings suggest mild enteritis. No obstruction present. 2.  Aortic Atherosclerosis (ICD10-I70.0). Electronically Signed   By: Suzy Bouchard M.D.   On: 07/24/2017 15:46    ASSESSMENT AND PLAN:   Nolin Grell  is a 82 y.o. male with a known history of hypertension, non-insulin-dependent diabetes mellitus, hypothyroidism presents to hospital secondary to dizziness and syncopal episode.  1. ARF-prerenal/ dehydration -IV fluids.  Hold metformin and lisinopril for now -Monitor renal function - slight improvement. -na down to 126. Encourage po diet and ensure  2.  Orthostatic hypotension-secondary to poor p.o. intake and dehydration -Hold blood pressure medications.   -IV fluids  -pt overall feels ok  3.  Hypothyroidism-continue Synthroid, checked TSH- normal.  4. Enteritis    Cipro+ Flagyl IV--change to oral (total 7 days)  5. Atypical pneumonia, MAI? -Cipro for now, Pulm consult-noted. Need sputum sample and afb if pt able to give -f/u dr Raul Del as out pt  6. Generalized weakness-be secondary to dehydration.  - Physical therapy-No PT needs -CT abdomen mild enteritis and known pulmonary mild nodular pattern in Right ML -pt overall feels better -encouraged ensure at home also- d/w dter  7.  Diabetes mellitus-Metformin on hold. Sliding scale insulin. -sugars well controlled. Consider holding metformin at d/c since sugars are qwell controlled and pt can f/u with PCP to see later if  it needs to be resumed or not.  8.  DVT prophylaxis-Lovenox.  9. Arthritis Prn tylenol D/c ibuprofen due to ARF  D/c in am if labs show improvement. [place] with pt and dter  All the records are reviewed and case discussed with Care Management/Social Workerr. Management plans discussed with the patient, family and they are in agreement.  CODE STATUS: Full.  TOTAL TIME TAKING CARE OF THIS PATIENT: 35 minutes.    POSSIBLE D/C IN 1DAYS, DEPENDING ON CLINICAL CONDITION.   Fritzi Mandes M.D on 07/26/2017   Between 7am to 6pm - Pager - [phone]  After 6pm go to [email or web address] - password [email or web address] [facility]  Office  [phone]  CC: Primary care physician; System, Pcp Not In  Note: This dictation was prepared with Dragon dictation along with smaller phrase technology. Any transcriptional errors that result from this process are unintentional.

## 2017-07-27 LAB — BASIC METABOLIC PANEL
ANION GAP: 6 (ref 5–15)
BUN: 25 mg/dL — ABNORMAL HIGH (ref 6–20)
CHLORIDE: 100 mmol/L — AB (ref 101–111)
CO2: 21 mmol/L — AB (ref 22–32)
Calcium: 7.6 mg/dL — ABNORMAL LOW (ref 8.9–10.3)
Creatinine, Ser: 1.56 mg/dL — ABNORMAL HIGH (ref 0.61–1.24)
GFR calc non Af Amer: 41 mL/min — ABNORMAL LOW (ref >60)
GFR, EST AFRICAN AMERICAN: 47 mL/min — AB (ref >60)
GLUCOSE: 101 mg/dL — AB (ref 65–99)
Potassium: 4.1 mmol/L (ref 3.5–5.1)
Sodium: 127 mmol/L — ABNORMAL LOW (ref 135–145)

## 2017-07-27 LAB — GLUCOSE, CAPILLARY
GLUCOSE-CAPILLARY: 143 mg/dL — AB (ref 65–99)
GLUCOSE-CAPILLARY: 88 mg/dL (ref 65–99)
Glucose-Capillary: 103 mg/dL — ABNORMAL HIGH (ref 65–99)
Glucose-Capillary: 105 mg/dL — ABNORMAL HIGH (ref 65–99)

## 2017-07-27 LAB — RHEUMATOID FACTOR: Rhuematoid fact SerPl-aCnc: 13.3 IU/mL (ref 0.0–13.9)

## 2017-07-27 LAB — SODIUM: Sodium: 131 mmol/L — ABNORMAL LOW (ref 135–145)

## 2017-07-27 MED ORDER — SODIUM CHLORIDE 1 G PO TABS
1.0000 g | ORAL_TABLET | Freq: Once | ORAL | Status: DC
  Filled 2017-07-27: qty 1

## 2017-07-27 MED ORDER — SODIUM CHLORIDE 1 G PO TABS
1.0000 g | ORAL_TABLET | Freq: Once | ORAL | Status: AC
  Administered 2017-07-27: 1 g via ORAL
  Filled 2017-07-27: qty 1

## 2017-07-27 NOTE — Care Management Important Message (Signed)
Important Message  Patient Details  Name: Richard Acosta MRN: [id number] Date of Birth: May 27, 1935   Medicare Important Message Given:  Yes    [name] [name] 07/27/2017, 12:36 PM

## 2017-07-27 NOTE — Progress Notes (Signed)
[facility] [place] NAME: Richard Acosta    MR#:  [id number]  DATE OF BIRTH:  02-27-1936  SUBJECTIVE:  CHIEF COMPLAINT:   Chief Complaint  Patient presents with  . Weakness    came with weakness, fatigue, shortness of breath with minimal exertion, pain in all the joints.  No fever but have some chills.  feels a lot better today family the room REVIEW OF SYSTEMS:  CONSTITUTIONAL: No fever, he have fatigue or weakness.  EYES: No blurred or double vision.  EARS, NOSE, AND THROAT: No tinnitus or ear pain.  RESPIRATORY: No cough, shortness of breath, wheezing or hemoptysis.  CARDIOVASCULAR: No chest pain, orthopnea, edema.  GASTROINTESTINAL: No nausea, vomiting, diarrhea or abdominal pain.  GENITOURINARY: No dysuria, hematuria.  ENDOCRINE: No polyuria, nocturia,  HEMATOLOGY: No anemia, easy bruising or bleeding SKIN: No rash or lesion. MUSCULOSKELETAL: No joint pain or arthritis.   NEUROLOGIC: No tingling, numbness, weakness.  PSYCHIATRY: No anxiety or depression.   Review of Systems  Constitutional: Negative for chills, fever and weight loss.  HENT: Negative for ear discharge, ear pain and nosebleeds.   Eyes: Negative for blurred vision, pain and discharge.  Respiratory: Negative for sputum production, shortness of breath, wheezing and stridor.   Cardiovascular: Negative for chest pain, palpitations, orthopnea and PND.  Gastrointestinal: Negative for abdominal pain, diarrhea, nausea and vomiting.  Genitourinary: Negative for frequency and urgency.  Musculoskeletal: Negative for back pain and joint pain.  Neurological: Negative for sensory change, speech change, focal weakness and weakness.  Psychiatric/Behavioral: Negative for depression and hallucinations. The patient is not nervous/anxious.     DRUG ALLERGIES:  No Known Allergies  VITALS:  Blood pressure (!) 84/55, pulse 72, temperature 98.5 F (36.9 C), temperature source Oral,  resp. rate 16, height 5\' 6"  (1.676 m), weight 71.2 kg (157 lb), SpO2 98 %.  PHYSICAL EXAMINATION:  GENERAL:  82 y.o.-year-old patient lying in the bed with no acute distress.  EYES: Pupils equal, round, reactive to light and accommodation. No scleral icterus. Extraocular muscles intact.  HEENT: Head atraumatic, normocephalic. Oropharynx and nasopharynx clear.  NECK:  Supple, no jugular venous distention. No thyroid enlargement, no tenderness.  LUNGS: Normal breath sounds bilaterally, no wheezing, rales,rhonchi or crepitation. No use of accessory muscles of respiration.  CARDIOVASCULAR: S1, S2 normal. No murmurs, rubs, or gallops.  ABDOMEN: Soft, nontender, nondistended. Bowel sounds present. No organomegaly or mass. Small blister on left flank, clear fluid EXTREMITIES: No pedal edema, cyanosis, or clubbing.  NEUROLOGIC: Cranial nerves II through XII are intact. Muscle strength 4/5 in all extremities. Sensation intact. Gait not checked.  PSYCHIATRIC: patient is alert and oriented x 3.  SKIN: No obvious rash, lesion, or ulcer.   Physical Exam LABORATORY PANEL:   CBC Recent Labs  Lab 07/26/17 0358  WBC 8.3  HGB 11.2*  HCT 32.0*  PLT 191   ------------------------------------------------------------------------------------------------------------------  Chemistries  Recent Labs  Lab 07/23/17 0817  07/27/17 0809  NA 134*   < > 127*  K 4.7   < > 4.1  CL 102   < > 100*  CO2 25   < > 21*  GLUCOSE 121*   < > 101*  BUN 30*   < > 25*  CREATININE 1.56*   < > 1.56*  CALCIUM 9.2   < > 7.6*  AST 21  --   --   ALT 11*  --   --   ALKPHOS 52  --   --  BILITOT 1.0  --   --    < > = values in this interval not displayed.   ------------------------------------------------------------------------------------------------------------------  Cardiac Enzymes Recent Labs  Lab 07/23/17 0817  TROPONINI <0.03    ------------------------------------------------------------------------------------------------------------------  RADIOLOGY:  No results found.  ASSESSMENT AND PLAN:   Richard Acosta  is a 82 y.o. male with a known history of hypertension, non-insulin-dependent diabetes mellitus, hypothyroidism presents to hospital secondary to dizziness and syncopal episode.  1. ARF-prerenal/ dehydration -IV fluids.  Hold metformin and lisinopril for now -Monitor renal function - slight improvement. -na down to 126. Encourage po diet and ensure--127  2.  Orthostatic hypotension-secondary to poor p.o. intake and dehydration -Hold blood pressure medications.   -IV fluids  -pt overall feels ok  3.  Hypothyroidism-continue Synthroid, checked TSH- normal.  4. Enteritis    Cipro+ Flagyl IV--change to oral (total 7 days)  5. Atypical pneumonia, MAI? -Cipro for now, Pulm consult-noted. Need sputum sample and afb if pt able to give -f/u dr Raul Del as out pt  6. Generalized weakness-be secondary to dehydration.  - Physical therapy-No PT needs -CT abdomen mild enteritis and known pulmonary mild nodular pattern in Right ML -pt overall feels better -encouraged ensure at home also- d/w dter  7.  Diabetes mellitus-Metformin on hold. Sliding scale insulin. -sugars well controlled. Consider holding metformin at d/c since sugars are qwell controlled and pt can f/u with PCP to see later if it needs to be resumed or not.  8.  DVT prophylaxis-Lovenox.  9. Arthritis Prn tylenol D/c ibuprofen due to ARF  Spoke with family in the room. No PT needs at home.  All the records are reviewed and case discussed with Care Management/Social Workerr. Management plans discussed with the patient, family and they are in agreement.  CODE STATUS: Full.  TOTAL TIME TAKING CARE OF THIS PATIENT: 35 minutes.    POSSIBLE D/C IN 1DAYS, DEPENDING ON CLINICAL CONDITION.   Fritzi Mandes M.D on 07/27/2017   Between  7am to 6pm - Pager - [phone]  After 6pm go to [email or web address] - password [email or web address] [facility]  Office  [phone]  CC: Primary care physician; System, Pcp Not In  Note: This dictation was prepared with Dragon dictation along with smaller phrase technology. Any transcriptional errors that result from this process are unintentional.

## 2017-07-28 LAB — SODIUM: SODIUM: 131 mmol/L — AB (ref 135–145)

## 2017-07-28 LAB — GLUCOSE, CAPILLARY: GLUCOSE-CAPILLARY: 91 mg/dL (ref 65–99)

## 2017-07-28 MED ORDER — METFORMIN HCL 500 MG PO TABS
1000.0000 mg | ORAL_TABLET | Freq: Every day | ORAL | Status: DC
  Administered 2017-07-28: 10:00:00 1000 mg via ORAL
  Filled 2017-07-28: qty 2

## 2017-07-28 MED ORDER — METRONIDAZOLE 500 MG PO TABS: 500.0000 mg | ORAL_TABLET | Freq: Three times a day (TID) | ORAL | 0 refills | Status: AC

## 2017-07-28 MED ORDER — CIPROFLOXACIN HCL 500 MG PO TABS: 500.0000 mg | ORAL_TABLET | Freq: Two times a day (BID) | ORAL | 0 refills | Status: DC

## 2017-07-28 MED ORDER — SODIUM CHLORIDE 1 G PO TABS
1.0000 g | ORAL_TABLET | Freq: Once | ORAL | Status: AC
  Administered 2017-07-28: 1 g via ORAL
  Filled 2017-07-28: qty 1

## 2017-07-28 MED ORDER — SODIUM CHLORIDE 1 G PO TABS: 1.0000 g | ORAL_TABLET | Freq: Two times a day (BID) | ORAL | Status: DC

## 2017-07-28 MED ORDER — SODIUM CHLORIDE 1 G PO TABS: 1.0000 g | ORAL_TABLET | Freq: Two times a day (BID) | ORAL | 0 refills | Status: DC

## 2017-07-28 MED ORDER — ADULT MULTIVITAMIN W/MINERALS CH: 1.0000 | ORAL_TABLET | Freq: Every day | ORAL | 0 refills | Status: AC

## 2017-07-28 MED ORDER — ENSURE ENLIVE PO LIQD: 237.0000 mL | Freq: Two times a day (BID) | ORAL | 12 refills | Status: AC

## 2017-07-28 NOTE — Discharge Instructions (Signed)
The patient and wife instructed to follow up with Dr. Dareen Piano to get labs (BMP) done in the next few days. There are in the process of changing primary care physician.

## 2017-07-28 NOTE — Evaluation (Signed)
Physical Therapy ReEvaluation Patient Details Name: Richard Acosta MRN: [id number] DOB: 1936-01-01 Today's Date: 07/28/2017   History of Present Illness  82 y.o. male with a known history of hypertension, non-insulin-dependent diabetes mellitus, hypothyroidism presents to hospital secondary to dizziness/syncopal episode and acute renal failure.  Clinical Impression  Pt was seen by this PT ~5 days ago and he did very well at that time, near baseline.  He has only been out of bed/active minimally in the interim and is now feeling very stiff and weak.  Pt/family feelings are confirmed as pt is much weak and stiffer this PT re-eval than a few days ago.  He was still able to ambulate ~200 ft but was much slower, less confident and required an AD.  He also was much slower and more labored with bed mobility and did need HHA to pull up to sitting.  Pt/family was concerned that he would not be able to go home, but he did well enough today to be able to do so with assist and HHPT.      Follow Up Recommendations Home health PT    Equipment Recommendations       Recommendations for Other Services       Precautions / Restrictions Precautions Precautions: Fall Restrictions Weight Bearing Restrictions: No      Mobility  Bed Mobility Overal bed mobility: Needs Assistance Bed Mobility: Supine to Sit     Supine to sit: Min assist     General bed mobility comments: Pt needed a lot of time and effort (but no phyiscal assist) to get LEs to EOB, did need light HHA to get up to sitting at EOB  Transfers Overall transfer level: Modified independent Equipment used: Rolling walker (2 wheeled)             General transfer comment: Pt needed extra cuing to insure shifting weight forward and slow to rise.    Ambulation/Gait Ambulation/Gait assistance: Min guard Ambulation Distance (Feet): 200 Feet Assistive device: Rolling walker (2 wheeled)       General Gait Details: Pt with slow,  guarded ambulation with stiff knees and generally guarded cadence.  He was able to walk ~15 ft with hand rail and ~15 ft with no UE use but was much safer and more effective with AD.  Encouraged him to use this at home initially  Financial trader [employer] (Stroke Patients Only)       Balance Overall balance assessment: Independent                                           Pertinent Vitals/Pain Pain Assessment: 0-10(b/l knees/groins very sore/stiff) Pain Score: 5     Home Living Family/patient expects to be discharged to:: Private residence Living Arrangements: Spouse/significant other Available Help at Discharge: Family   Home Access: Stairs to enter Entrance Stairs-Rails: None Entrance Stairs-Number of Steps: 1 Home Layout: One level Home Equipment: Walker - 2 wheels      Prior Function Level of Independence: Independent with assistive device(s);Independent         Comments: Pt still working, drives, able to run light errands     Hand Dominance        Extremity/Trunk Assessment   Upper Extremity Assessment Upper Extremity Assessment: Generalized weakness(unable to elevate b/l shoulders >  120)    Lower Extremity Assessment Lower Extremity Assessment: Generalized weakness(lacks TKE b/l, grossly 3+ to 4-/5 in available range)       Communication   Communication: No difficulties  Cognition Arousal/Alertness: Awake/alert Behavior During Therapy: WFL for tasks assessed/performed Overall Cognitive Status: Within Functional Limits for tasks assessed                                        General Comments      Exercises     Assessment/Plan    PT Assessment Patient needs continued PT services  PT Problem List Decreased strength;Decreased range of motion;Decreased activity tolerance;Decreased balance;Decreased mobility;Decreased coordination;Decreased knowledge of use of DME;Decreased  safety awareness       PT Treatment Interventions DME instruction;Gait training;Stair training;Functional mobility training;Therapeutic activities;Therapeutic exercise;Neuromuscular re-education;Balance training;Patient/family education    PT Goals (Current goals can be found in the Care Plan section)  Acute Rehab PT Goals Patient Stated Goal: get stronger PT Goal Formulation: With patient/family Time For Goal Achievement: 08/11/17 Potential to Achieve Goals: Good    Frequency Min 2X/week   Barriers to discharge        Co-evaluation               AM-PAC PT "6 Clicks" Daily Activity  Outcome Measure Difficulty turning over in bed (including adjusting bedclothes, sheets and blankets)?: A Little Difficulty moving from lying on back to sitting on the side of the bed? : Unable Difficulty sitting down on and standing up from a chair with arms (e.g., wheelchair, bedside commode, etc,.)?: A Little Help needed moving to and from a bed to chair (including a wheelchair)?: None Help needed walking in hospital room?: A Little Help needed climbing 3-5 steps with a railing? : A Little 6 Click Score: 17    End of Session Equipment Utilized During Treatment: Gait belt Activity Tolerance: Patient limited by fatigue Patient left: with family/visitor present;with call bell/phone within reach;with chair alarm set Nurse Communication: Mobility status PT Visit Diagnosis: Muscle weakness (generalized) (M62.81)    Time: 8416-6063 PT Time Calculation (min) (ACUTE ONLY): 29 min   Charges:   PT Evaluation $PT Re-evaluation: 1 Re-eval PT Treatments $Gait Training: 8-22 mins   PT G Codes:        Kreg Shropshire, DPT 07/28/2017, 11:16 AM

## 2017-07-28 NOTE — Discharge Summary (Signed)
[facility] [place] NAME: Richard Acosta    MR#:  [id number]  DATE OF BIRTH:  08-Apr-1936  DATE OF ADMISSION:  07/23/2017 ADMITTING PHYSICIAN: Richard Lighter, MD  DATE OF DISCHARGE: 07/28/2017  PRIMARY CARE PHYSICIAN: System, Pcp Not In    ADMISSION DIAGNOSIS:  AKI (acute kidney injury) ([place]) [N17.9] Syncope, unspecified syncope type [R55]  DISCHARGE DIAGNOSIS:  acute renal failure due to dehydration acute hyponatremia secondary to poor PO intake generalized weakness  SECONDARY DIAGNOSIS:   Past Medical History:  Diagnosis Date  . Diabetes mellitus without complication ([place])   . Hyperlipidemia   . Hypertension   . Hypothyroidism     HOSPITAL COURSE:  Richard Acosta a82 y.o.malewith a known history of hypertension, non-insulin-dependent diabetes mellitus, hypothyroidism presents to hospital secondary to dizziness and syncopal episode.  1. ARF-prerenal/ dehydration -received IV fluids. -resumed  metformin  -d/c lisinopril due to soft bp  -NA  down to 126. Encourage po diet and ensure--127--NACL tab--131--131 -will give salt tabs for 3 more days  2. Orthostatic hypotension-secondary to poor p.o. intake and dehydration -Hold blood pressure medications.  -pt overall feels ok-- ambulated well with physical therapy  3. Hypothyroidism-continue Synthroid, checked TSH- normal.  4. Enteritis    Cipro+ Flagyl IV--change to oral (total 7 days)  5. Atypical pneumonia, MAI? -Cipro for now, Pulm consult-noted. Need sputum sample and afb if pt able to give -f/u dr Richard Acosta as out pt  6.Generalized weakness-be secondary to dehydration.  - Physical therapy- HHPT with re-eval -CT abdomen mild enteritis and known pulmonary mild nodular pattern in Right ML -pt overall feels better -encouraged ensure at home also- d/w dter  7. Diabetes mellitus-resumed Metformin at discharge - Sliding scale insulin. -sugars  well controlled. Explain wife to hold metformin sugars are testing on the lower side and patient's appetite has not improved. She voiced understanding.  8. DVT prophylaxis-Lovenox.  9. Arthritis Prn tylenol D/c ibuprofen due to ARF  Overall stable. Discharged home with home health PT. Discussed discharge plan with wife.  CONSULTS OBTAINED:  Treatment Team:  Richard Pian, MD  DRUG ALLERGIES:  No Known Allergies  DISCHARGE MEDICATIONS:   Allergies as of 07/28/2017   No Known Allergies     Medication List    STOP taking these medications   lisinopril 10 MG tablet Commonly known as:  PRINIVIL,ZESTRIL     TAKE these medications   ciprofloxacin 500 MG tablet Commonly known as:  CIPRO Take 1 tablet (500 mg total) by mouth 2 (two) times daily. What changed:  when to take this   ezetimibe-simvastatin 10-40 MG tablet Commonly known as:  VYTORIN Take 1 tablet by mouth daily.   feeding supplement (ENSURE ENLIVE) Liqd Take 237 mLs by mouth 2 (two) times daily between meals.   levothyroxine 50 MCG tablet Commonly known as:  SYNTHROID, LEVOTHROID Take 50 mcg by mouth daily before breakfast.   metFORMIN 1000 MG tablet Commonly known as:  GLUCOPHAGE Take 1,000 mg by mouth daily with breakfast.   metroNIDAZOLE 500 MG tablet Commonly known as:  FLAGYL Take 1 tablet (500 mg total) by mouth every 8 (eight) hours for 1 day.   multivitamin with minerals Tabs tablet Take 1 tablet by mouth daily. Start taking on:  07/29/2017   sodium chloride 1 g tablet Take 1 tablet (1 g total) by mouth 2 (two) times daily with a meal. Start taking on:  07/29/2017   tamsulosin 0.4 MG Caps capsule Commonly  known as:  FLOMAX Take 0.4 mg by mouth daily after supper.   timolol 0.5 % ophthalmic solution Commonly known as:  BETIMOL Place 1 drop into both eyes daily.       If you experience worsening of your admission symptoms, develop shortness of breath, life threatening emergency,  suicidal or homicidal thoughts you must seek medical attention immediately by calling 911 or calling your MD immediately  if symptoms less severe.  You Must read complete instructions/literature along with all the possible adverse reactions/side effects for all the Medicines you take and that have been prescribed to you. Take any new Medicines after you have completely understood and accept all the possible adverse reactions/side effects.   Please note  You were cared for by a hospitalist during your hospital stay. If you have any questions about your discharge medications or the care you received while you were in the hospital after you are discharged, you can call the unit and asked to speak with the hospitalist on call if the hospitalist that took care of you is not available. Once you are discharged, your primary care physician will handle any further medical issues. Please note that NO REFILLS for any discharge medications will be authorized once you are discharged, as it is imperative that you return to your primary care physician (or establish a relationship with a primary care physician if you do not have one) for your aftercare needs so that they can reassess your need for medications and monitor your lab values. Today   SUBJECTIVE  feels better.   VITAL SIGNS:  Blood pressure (!) 109/54, pulse 69, temperature 98.4 F (36.9 C), temperature source Oral, resp. rate 15, height 5\' 6"  (1.676 m), weight 71.2 kg (157 lb), SpO2 98 %.  I/O:    Intake/Output Summary (Last 24 hours) at 07/28/2017 1032 Last data filed at 07/28/2017 0900 Gross per 24 hour  Intake 2855 ml  Output 1270 ml  Net 1585 ml    PHYSICAL EXAMINATION:  GENERAL:  82 y.o.-year-old patient lying in the bed with no acute distress.  EYES: Pupils equal, round, reactive to light and accommodation. No scleral icterus. Extraocular muscles intact.  HEENT: Head atraumatic, normocephalic. Oropharynx and nasopharynx clear.  NECK:   Supple, no jugular venous distention. No thyroid enlargement, no tenderness.  LUNGS: Normal breath sounds bilaterally, no wheezing, rales,rhonchi or crepitation. No use of accessory muscles of respiration.  CARDIOVASCULAR: S1, S2 normal. No murmurs, rubs, or gallops.  ABDOMEN: Soft, non-tender, non-distended. Bowel sounds present. No organomegaly or mass.  EXTREMITIES: No pedal edema, cyanosis, or clubbing.  NEUROLOGIC: Cranial nerves II through XII are intact. Muscle strength 5/5 in all extremities. Sensation intact. Gait not checked.  PSYCHIATRIC: The patient is alert and oriented x 3.  SKIN: No obvious rash, lesion, or ulcer.   DATA REVIEW:   CBC  Recent Labs  Lab 07/26/17 0358  WBC 8.3  HGB 11.2*  HCT 32.0*  PLT 191    Chemistries  Recent Labs  Lab 07/23/17 0817  07/27/17 0809  07/28/17 0510  NA 134*   < > 127*   < > 131*  K 4.7   < > 4.1  --   --   CL 102   < > 100*  --   --   CO2 25   < > 21*  --   --   GLUCOSE 121*   < > 101*  --   --   BUN 30*   < >  25*  --   --   CREATININE 1.56*   < > 1.56*  --   --   CALCIUM 9.2   < > 7.6*  --   --   AST 21  --   --   --   --   ALT 11*  --   --   --   --   ALKPHOS 52  --   --   --   --   BILITOT 1.0  --   --   --   --    < > = values in this interval not displayed.    Microbiology Results   Recent Results (from the past 240 hour(s))  CULTURE, BLOOD (ROUTINE X 2) w Reflex to ID Panel     Status: None (Preliminary result)   Collection Time: 07/24/17 11:57 AM  Result Value Ref Range Status   Specimen Description BLOOD LT [facility]  Final   Special Requests   Final    BOTTLES DRAWN AEROBIC AND ANAEROBIC Blood Culture adequate volume   Culture   Final    NO GROWTH 4 DAYS Performed at [facility], [address], [place], [place] [address]    Report Status PENDING  Incomplete  CULTURE, BLOOD (ROUTINE X 2) w Reflex to ID Panel     Status: None (Preliminary result)   Collection Time: 07/24/17 11:57 AM  Result Value Ref  Range Status   Specimen Description BLOOD RT FA  Final   Special Requests   Final    BOTTLES DRAWN AEROBIC AND ANAEROBIC Blood Culture results may not be optimal due to an excessive volume of blood received in culture bottles   Culture   Final    NO GROWTH 4 DAYS Performed at [facility], [address], [place], [place] [address]    Report Status PENDING  Incomplete  Culture, expectorated sputum-assessment     Status: None   Collection Time: 07/26/17 12:40 PM  Result Value Ref Range Status   Specimen Description SPUTUM  Final   Special Requests Normal  Final   Sputum evaluation   Final    THIS SPECIMEN IS ACCEPTABLE FOR SPUTUM CULTURE Performed at [facility], [address] [place], [place] [address]    Report Status 07/26/2017 FINAL  Final  Culture, respiratory (NON-Expectorated)     Status: None (Preliminary result)   Collection Time: 07/26/17 12:40 PM  Result Value Ref Range Status   Specimen Description   Final    SPUTUM Performed at [facility], [address], [place], [place] [address]    Special Requests   Final    Normal Reflexed from [id number] Performed at [facility], [address] [place], [place] [address]    Gram Stain   Final    FEW WBC PRESENT, PREDOMINANTLY PMN RARE SQUAMOUS EPITHELIAL CELLS PRESENT MODERATE GRAM POSITIVE COCCI IN CLUSTERS    Culture   Final    CULTURE REINCUBATED FOR BETTER GROWTH Performed at [facility], [place] [address] [place],  [address]    Report Status PENDING  Incomplete    RADIOLOGY:  No results found.   Management plans discussed with the patient, family and they are in agreement.  CODE STATUS:     Code Status Orders  (From admission, onward)        Start     Ordered   07/23/17 1300  Full code  Continuous     07/23/17 1259  Code Status History    This patient has a current code status but no historical code status.      TOTAL TIME TAKING CARE OF  THIS PATIENT: *40* minutes.    Fritzi Mandes M.D on 07/28/2017 at 10:32 AM  Between 7am to 6pm - Pager - [phone] After 6pm go to [email or web address] - password [email or web address] [facility]  Office  [phone]  CC: Primary care physician; System, Pcp Not In

## 2017-07-28 NOTE — Care Management Note (Signed)
Case Management Note  Patient Details  Name: Richard Acosta MRN: [id number] Date of Birth: 1936/02/21  Subjective/Objective:  Discharging today                  Action/Plan: Received referral from primary nurse that PT recommending [facility]. Spoke with wife, Edd Fabian. She states prior to admission, patient was working and running his own business. He has a walker and bsc. Offered choice of home health agencies and they prefer [facility]. Referral to [facility] with [facility]. PCP is Dr. Ronnald Collum. Last seen 1 week ago.    Expected Discharge Date:  07/28/17               Expected Discharge Plan:  [place]  In-House Referral:     Discharge planning Services  CM Consult  Post Acute Care Choice:    Choice offered to:  Spouse  DME Arranged:    DME Agency:     HH Arranged:  PT [place]:  [place]  Status of Service:  Completed, signed off  If discussed at [place] of Stay Meetings, dates discussed:    Additional Comments:  Jolly Mango, RN 07/28/2017, 11:10 AM

## 2017-07-29 LAB — CULTURE, RESPIRATORY W GRAM STAIN: Culture: NORMAL

## 2017-07-29 LAB — CULTURE, BLOOD (ROUTINE X 2)
CULTURE: NO GROWTH
CULTURE: NO GROWTH
SPECIAL REQUESTS: ADEQUATE

## 2017-07-29 LAB — CULTURE, RESPIRATORY: SPECIAL REQUESTS: NORMAL

## 2017-07-29 LAB — ANCA TITERS

## 2017-08-01 ENCOUNTER — Inpatient Hospital Stay: Payer: Medicare HMO

## 2017-08-01 ENCOUNTER — Other Ambulatory Visit: Payer: Self-pay

## 2017-08-01 ENCOUNTER — Emergency Department: Payer: Medicare HMO

## 2017-08-01 ENCOUNTER — Inpatient Hospital Stay
Admission: EM | Admit: 2017-08-01 | Discharge: 2017-08-02 | DRG: 392 | Disposition: A | Discharge status: Home / Self Care | Payer: Medicare HMO | Attending: Specialist | Admitting: Specialist

## 2017-08-01 DIAGNOSIS — R1030 Lower abdominal pain, unspecified: Secondary | ICD-10-CM

## 2017-08-01 DIAGNOSIS — Z833 Family history of diabetes mellitus: Secondary | ICD-10-CM | POA: Diagnosis not present

## 2017-08-01 DIAGNOSIS — E861 Hypovolemia: Secondary | ICD-10-CM | POA: Diagnosis present

## 2017-08-01 DIAGNOSIS — Z9049 Acquired absence of other specified parts of digestive tract: Secondary | ICD-10-CM

## 2017-08-01 DIAGNOSIS — I10 Essential (primary) hypertension: Secondary | ICD-10-CM | POA: Diagnosis present

## 2017-08-01 DIAGNOSIS — Z7984 Long term (current) use of oral hypoglycemic drugs: Secondary | ICD-10-CM | POA: Diagnosis not present

## 2017-08-01 DIAGNOSIS — Z87891 Personal history of nicotine dependence: Secondary | ICD-10-CM

## 2017-08-01 DIAGNOSIS — M79652 Pain in left thigh: Secondary | ICD-10-CM | POA: Diagnosis present

## 2017-08-01 DIAGNOSIS — R112 Nausea with vomiting, unspecified: Secondary | ICD-10-CM | POA: Diagnosis present

## 2017-08-01 DIAGNOSIS — K760 Fatty (change of) liver, not elsewhere classified: Secondary | ICD-10-CM | POA: Diagnosis present

## 2017-08-01 DIAGNOSIS — E1139 Type 2 diabetes mellitus with other diabetic ophthalmic complication: Secondary | ICD-10-CM | POA: Diagnosis present

## 2017-08-01 DIAGNOSIS — R740 Nonspecific elevation of levels of transaminase and lactic acid dehydrogenase [LDH]: Secondary | ICD-10-CM | POA: Diagnosis present

## 2017-08-01 DIAGNOSIS — R197 Diarrhea, unspecified: Secondary | ICD-10-CM | POA: Diagnosis not present

## 2017-08-01 DIAGNOSIS — E785 Hyperlipidemia, unspecified: Secondary | ICD-10-CM | POA: Diagnosis present

## 2017-08-01 DIAGNOSIS — E039 Hypothyroidism, unspecified: Secondary | ICD-10-CM | POA: Diagnosis present

## 2017-08-01 DIAGNOSIS — E871 Hypo-osmolality and hyponatremia: Secondary | ICD-10-CM | POA: Diagnosis present

## 2017-08-01 DIAGNOSIS — K529 Noninfective gastroenteritis and colitis, unspecified: Principal | ICD-10-CM | POA: Diagnosis present

## 2017-08-01 DIAGNOSIS — H42 Glaucoma in diseases classified elsewhere: Secondary | ICD-10-CM | POA: Diagnosis present

## 2017-08-01 DIAGNOSIS — Z79890 Hormone replacement therapy (postmenopausal): Secondary | ICD-10-CM | POA: Diagnosis not present

## 2017-08-01 DIAGNOSIS — Z79899 Other long term (current) drug therapy: Secondary | ICD-10-CM

## 2017-08-01 DIAGNOSIS — M79651 Pain in right thigh: Secondary | ICD-10-CM | POA: Diagnosis present

## 2017-08-01 DIAGNOSIS — H409 Unspecified glaucoma: Secondary | ICD-10-CM | POA: Diagnosis present

## 2017-08-01 DIAGNOSIS — N400 Enlarged prostate without lower urinary tract symptoms: Secondary | ICD-10-CM | POA: Diagnosis present

## 2017-08-01 DIAGNOSIS — E119 Type 2 diabetes mellitus without complications: Secondary | ICD-10-CM

## 2017-08-01 DIAGNOSIS — R7401 Elevation of levels of liver transaminase levels: Secondary | ICD-10-CM

## 2017-08-01 LAB — URINE DRUG SCREEN, QUALITATIVE (ARMC ONLY)
AMPHETAMINES, UR SCREEN: NOT DETECTED
BARBITURATES, UR SCREEN: NOT DETECTED
BENZODIAZEPINE, UR SCRN: NOT DETECTED
Cannabinoid 50 Ng, Ur ~~LOC~~: NOT DETECTED
Cocaine Metabolite,Ur ~~LOC~~: NOT DETECTED
MDMA (Ecstasy)Ur Screen: NOT DETECTED
METHADONE SCREEN, URINE: NOT DETECTED
OPIATE, UR SCREEN: POSITIVE — AB
Phencyclidine (PCP) Ur S: NOT DETECTED
Tricyclic, Ur Screen: NOT DETECTED

## 2017-08-01 LAB — URINALYSIS, COMPLETE (UACMP) WITH MICROSCOPIC
BILIRUBIN URINE: NEGATIVE
GLUCOSE, UA: NEGATIVE mg/dL
HGB URINE DIPSTICK: NEGATIVE
Ketones, ur: NEGATIVE mg/dL
LEUKOCYTES UA: NEGATIVE
NITRITE: NEGATIVE
PROTEIN: NEGATIVE mg/dL
SPECIFIC GRAVITY, URINE: 1.005 (ref 1.005–1.030)
pH: 8 (ref 5.0–8.0)

## 2017-08-01 LAB — COMPREHENSIVE METABOLIC PANEL
ALBUMIN: 3.4 g/dL — AB (ref 3.5–5.0)
ALK PHOS: 49 U/L (ref 38–126)
ALT: 14 U/L — AB (ref 17–63)
AST: 46 U/L — AB (ref 15–41)
Anion gap: 7 (ref 5–15)
BILIRUBIN TOTAL: 1 mg/dL (ref 0.3–1.2)
BUN: 11 mg/dL (ref 6–20)
CO2: 23 mmol/L (ref 22–32)
CREATININE: 1.13 mg/dL (ref 0.61–1.24)
Calcium: 8.6 mg/dL — ABNORMAL LOW (ref 8.9–10.3)
Chloride: 94 mmol/L — ABNORMAL LOW (ref 101–111)
GFR calc Af Amer: >60 mL/min (ref >60)
GFR, EST NON AFRICAN AMERICAN: 60 mL/min — AB (ref >60)
GLUCOSE: 117 mg/dL — AB (ref 65–99)
POTASSIUM: 4.9 mmol/L (ref 3.5–5.1)
Sodium: 124 mmol/L — ABNORMAL LOW (ref 135–145)
TOTAL PROTEIN: 6.8 g/dL (ref 6.5–8.1)

## 2017-08-01 LAB — CBC
HEMATOCRIT: 38.9 % — AB (ref 40.0–52.0)
HEMATOCRIT: 36.7 % — AB (ref 40.0–52.0)
HEMOGLOBIN: 12.8 g/dL — AB (ref 13.0–18.0)
Hemoglobin: 13.6 g/dL (ref 13.0–18.0)
MCH: 29.9 pg (ref 26.0–34.0)
MCH: 29.9 pg (ref 26.0–34.0)
MCHC: 34.9 g/dL (ref 32.0–36.0)
MCHC: 34.9 g/dL (ref 32.0–36.0)
MCV: 85.5 fL (ref 80.0–100.0)
MCV: 85.6 fL (ref 80.0–100.0)
PLATELETS: 328 10*3/uL (ref 150–440)
Platelets: 317 10*3/uL (ref 150–440)
RBC: 4.54 MIL/uL (ref 4.40–5.90)
RBC: 4.29 MIL/uL — ABNORMAL LOW (ref 4.40–5.90)
RDW: 13.3 % (ref 11.5–14.5)
RDW: 12.9 % (ref 11.5–14.5)
WBC: 8.5 10*3/uL (ref 3.8–10.6)
WBC: 8 10*3/uL (ref 3.8–10.6)

## 2017-08-01 LAB — CK: CK TOTAL: 123 U/L (ref 49–397)

## 2017-08-01 LAB — LIPASE, BLOOD: Lipase: 76 U/L — ABNORMAL HIGH (ref 11–51)

## 2017-08-01 LAB — CREATININE, SERUM
Creatinine, Ser: 1.12 mg/dL (ref 0.61–1.24)
GFR calc Af Amer: >60 mL/min (ref >60)

## 2017-08-01 LAB — SEDIMENTATION RATE: SED RATE: 25 mm/h — AB (ref 0–20)

## 2017-08-01 LAB — TSH: TSH: 2.018 u[IU]/mL (ref 0.350–4.500)

## 2017-08-01 LAB — GLUCOSE, CAPILLARY
GLUCOSE-CAPILLARY: 99 mg/dL (ref 65–99)
GLUCOSE-CAPILLARY: 98 mg/dL (ref 65–99)

## 2017-08-01 LAB — LACTIC ACID, PLASMA: LACTIC ACID, VENOUS: 0.9 mmol/L (ref 0.5–1.9)

## 2017-08-01 MED ORDER — ONDANSETRON HCL 4 MG/2ML IJ SOLN
4.0000 mg | Freq: Once | INTRAMUSCULAR | Status: AC
  Administered 2017-08-01: 4 mg via INTRAVENOUS
  Filled 2017-08-01: qty 2

## 2017-08-01 MED ORDER — SODIUM CHLORIDE 0.9 % IV SOLN
INTRAVENOUS | Status: AC
  Administered 2017-08-01 – 2017-08-02 (×2): via INTRAVENOUS

## 2017-08-01 MED ORDER — TIMOLOL MALEATE 0.5 % OP SOLN
1.0000 [drp] | Freq: Every day | OPHTHALMIC | Status: DC
  Administered 2017-08-01: 1 [drp] via OPHTHALMIC
  Filled 2017-08-01: qty 5

## 2017-08-01 MED ORDER — SODIUM CHLORIDE 1 G PO TABS
2.0000 g | ORAL_TABLET | Freq: Three times a day (TID) | ORAL | Status: DC
  Administered 2017-08-01 – 2017-08-02 (×4): 2 g via ORAL
  Filled 2017-08-01 (×5): qty 2

## 2017-08-01 MED ORDER — ONDANSETRON HCL 4 MG/2ML IJ SOLN: 4.0000 mg | Freq: Four times a day (QID) | INTRAMUSCULAR | Status: DC | PRN

## 2017-08-01 MED ORDER — HYDROCODONE-ACETAMINOPHEN 5-325 MG PO TABS: 1.0000 | ORAL_TABLET | ORAL | Status: DC | PRN

## 2017-08-01 MED ORDER — MORPHINE SULFATE (PF) 2 MG/ML IV SOLN
2.0000 mg | Freq: Once | INTRAVENOUS | Status: AC
Start: 2017-08-01 — End: 2017-08-01
  Administered 2017-08-01: 2 mg via INTRAVENOUS

## 2017-08-01 MED ORDER — HEPARIN SOD (PORK) LOCK FLUSH 10 UNIT/ML IV SOLN: 10.0000 [IU] | Freq: Once | INTRAVENOUS | Status: DC

## 2017-08-01 MED ORDER — TAMSULOSIN HCL 0.4 MG PO CAPS
0.4000 mg | ORAL_CAPSULE | Freq: Every day | ORAL | Status: DC
  Administered 2017-08-01 – 2017-08-02 (×2): 0.4 mg via ORAL
  Filled 2017-08-01 (×2): qty 1

## 2017-08-01 MED ORDER — INSULIN ASPART 100 UNIT/ML ~~LOC~~ SOLN: 0.0000 [IU] | Freq: Every day | SUBCUTANEOUS | Status: DC

## 2017-08-01 MED ORDER — LISINOPRIL 20 MG PO TABS
20.0000 mg | ORAL_TABLET | Freq: Every day | ORAL | Status: DC
  Administered 2017-08-01 – 2017-08-02 (×2): 20 mg via ORAL
  Filled 2017-08-01 (×2): qty 1

## 2017-08-01 MED ORDER — ACETAMINOPHEN 650 MG RE SUPP: 650.0000 mg | Freq: Four times a day (QID) | RECTAL | Status: DC | PRN

## 2017-08-01 MED ORDER — MORPHINE SULFATE (PF) 2 MG/ML IV SOLN
1.0000 mg | INTRAVENOUS | Status: DC | PRN
Start: 2017-08-01 — End: 2017-08-02

## 2017-08-01 MED ORDER — HEPARIN SODIUM (PORCINE) 5000 UNIT/ML IJ SOLN
5000.0000 [IU] | Freq: Three times a day (TID) | INTRAMUSCULAR | Status: DC
  Administered 2017-08-01 – 2017-08-02 (×3): 5000 [IU] via SUBCUTANEOUS
  Filled 2017-08-01 (×3): qty 1

## 2017-08-01 MED ORDER — ENSURE ENLIVE PO LIQD: 237.0000 mL | Freq: Two times a day (BID) | ORAL | Status: DC

## 2017-08-01 MED ORDER — ACETAMINOPHEN 325 MG PO TABS: 650.0000 mg | ORAL_TABLET | Freq: Four times a day (QID) | ORAL | Status: DC | PRN

## 2017-08-01 MED ORDER — IOPAMIDOL (ISOVUE-300) INJECTION 61%
75.0000 mL | Freq: Once | INTRAVENOUS | Status: AC | PRN
  Administered 2017-08-01: 75 mL via INTRAVENOUS

## 2017-08-01 MED ORDER — SODIUM CHLORIDE 0.9 % IV BOLUS
500.0000 mL | Freq: Once | INTRAVENOUS | Status: AC
  Administered 2017-08-01: 500 mL via INTRAVENOUS

## 2017-08-01 MED ORDER — FLORANEX PO PACK
1.0000 g | PACK | Freq: Three times a day (TID) | ORAL | Status: DC
  Administered 2017-08-01 – 2017-08-02 (×4): 1 g via ORAL
  Filled 2017-08-01 (×4): qty 1

## 2017-08-01 MED ORDER — ADULT MULTIVITAMIN W/MINERALS CH
1.0000 | ORAL_TABLET | Freq: Every day | ORAL | Status: DC
  Administered 2017-08-01 – 2017-08-02 (×2): 1 via ORAL
  Filled 2017-08-01 (×2): qty 1

## 2017-08-01 MED ORDER — LEVOTHYROXINE SODIUM 50 MCG PO TABS
50.0000 ug | ORAL_TABLET | Freq: Every day | ORAL | Status: DC
  Administered 2017-08-02: 50 ug via ORAL
  Filled 2017-08-01: qty 1

## 2017-08-01 MED ORDER — MORPHINE SULFATE (PF) 2 MG/ML IV SOLN
2.0000 mg | Freq: Once | INTRAVENOUS | Status: AC
  Administered 2017-08-01: 2 mg via INTRAVENOUS
  Filled 2017-08-01: qty 1

## 2017-08-01 MED ORDER — MELOXICAM 7.5 MG PO TABS
15.0000 mg | ORAL_TABLET | Freq: Every day | ORAL | Status: DC
  Administered 2017-08-01 – 2017-08-02 (×2): 15 mg via ORAL
  Filled 2017-08-01: qty 1
  Filled 2017-08-01: qty 2

## 2017-08-01 MED ORDER — ONDANSETRON HCL 4 MG PO TABS: 4.0000 mg | ORAL_TABLET | Freq: Four times a day (QID) | ORAL | Status: DC | PRN

## 2017-08-01 MED ORDER — TRAMADOL HCL 50 MG PO TABS
50.0000 mg | ORAL_TABLET | Freq: Four times a day (QID) | ORAL | Status: DC | PRN
  Administered 2017-08-01 – 2017-08-02 (×3): 50 mg via ORAL
  Filled 2017-08-01 (×3): qty 1

## 2017-08-01 MED ORDER — MORPHINE SULFATE (PF) 2 MG/ML IV SOLN
INTRAVENOUS | Status: AC
  Administered 2017-08-01: 2 mg via INTRAVENOUS
  Filled 2017-08-01: qty 1

## 2017-08-01 MED ORDER — INSULIN ASPART 100 UNIT/ML ~~LOC~~ SOLN: 0.0000 [IU] | Freq: Three times a day (TID) | SUBCUTANEOUS | Status: DC

## 2017-08-01 NOTE — Consult Note (Addendum)
Richard Darby, MD [address]  [place]  [place], [place] [address]  Main: [phone]  Fax: [phone] Pager: [phone]   Consultation  Referring Provider:     No ref. provider found Primary Care Physician:  Lin Landsman, MD Primary Gastroenterologist: none         Reason for Consultation:     Diarrhea and lower abdominal pain  Date of Admission:  08/01/2017 Date of Consultation:  08/01/2017         HPI:   Richard Acosta is a 82 y.o. male with history of diabetes, hypertension, hyperlipidemia who was recently admitted to [facility] about a week ago after a syncope episode.This was after an episode of acute diarrheal illness for 3 days which resulted in hyponatremia and acute kidney injury. He was incidentally found to have atypical pneumonia and acute enteritis based on the CT chest abdomen and pelvis that were order because of weight loss and joint pains. He was empirically treated with Cipro. His AFB stain on sputum samples are in process.  Patient's wife reports that he has been having diarrhea since he was discharged from the hospital associated with severe lower abdominal pain. He is having 3-4 episodes of loose, watery, nonbloody bowel movements daily associated with nausea and poor by mouth intake. Therefore, he presented today to [facility] ER. He was found to have mild hyponatremia. Underwent CT A/P which was unremarkable. GI consulted for further evaluation. Patient denies fever, chills, hematochezia. He does not smoke or drink alcohol   NSAIDs: ibuprofen 2-3 times daily for lower abdominal pain  Antiplts/Anticoagulants/Anti thrombotics: none  GI Procedures: reports undergoing flexible sigmoidoscopy several years ago  Past Medical History:  Diagnosis Date  . Diabetes mellitus without complication ([place])   . Hyperlipidemia   . Hypertension   . Hypothyroidism     Past Surgical History:  Procedure  Laterality Date  . APPENDECTOMY      Prior to Admission medications   Medication Sig Start Date End Date Taking? Authorizing Provider  ezetimibe-simvastatin (VYTORIN) 10-40 MG tablet Take 1 tablet by mouth daily.   Yes [provider]  feeding supplement, ENSURE ENLIVE, (ENSURE ENLIVE) LIQD Take 237 mLs by mouth 2 (two) times daily between meals. 07/28/17  Yes Fritzi Mandes, MD  levothyroxine (SYNTHROID, LEVOTHROID) 50 MCG tablet Take 50 mcg by mouth daily before breakfast.   Yes [provider]  lisinopril (PRINIVIL,ZESTRIL) 20 MG tablet Take 20 mg by mouth daily. 07/15/17  Yes [provider]  metFORMIN (GLUCOPHAGE) 1000 MG tablet Take 1,000 mg by mouth daily with breakfast.   Yes [provider]  Multiple Vitamin (MULTIVITAMIN WITH MINERALS) TABS tablet Take 1 tablet by mouth daily. 07/29/17  Yes Fritzi Mandes, MD  sodium chloride 1 g tablet Take 1 tablet (1 g total) by mouth 2 (two) times daily with a meal. 07/29/17  Yes Fritzi Mandes, MD  tamsulosin (FLOMAX) 0.4 MG CAPS capsule Take 0.4 mg by mouth daily after supper.   Yes [provider]  timolol (BETIMOL) 0.5 % ophthalmic solution Place 1 drop into both eyes at bedtime.    Yes [provider]    Family History  Problem Relation Age of Onset  . Diabetes Mother   . Diabetes Father      Social History   Tobacco Use  . Smoking status: Former Research scientist (life sciences)  . Smokeless tobacco: Never Used  . Tobacco comment: quit about 50 years ago  Substance Use Topics  . Alcohol use: Not Currently    Alcohol/week: 1.2 oz    Types: 2 Glasses of wine per week    Frequency: Never  . Drug use: Not Currently    Allergies as of 08/01/2017  . (No Known Allergies)    Review of Systems:    All systems reviewed and negative except where noted in HPI.   Physical Exam:  Vital signs in last 24 hours: Temp:  [97.8 F (36.6 C)] 97.8 F (36.6 C) (04/20 1319) Pulse Rate:  [66-72] 70 (04/20 1500) Resp:  [16] 16  (04/20 1319) BP: (151-162)/(73-81) 162/73 (04/20 1500) SpO2:  [97 %-100 %] 98 % (04/20 1500) Weight:  [160 lb (72.6 kg)] 160 lb (72.6 kg) (04/20 1320) Last BM Date: 08/01/17 General:   Pleasant, cooperative in NAD Head:  Normocephalic and atraumatic. Eyes:   No icterus.   Conjunctiva pink. PERRLA. Ears:  Normal auditory acuity. Neck:  Supple; no masses or thyroidomegaly Lungs: Respirations even and unlabored. Lungs clear to auscultation bilaterally.   No wheezes, crackles, or rhonchi.  Heart:  Regular rate and rhythm;  Without murmur, clicks, rubs or gallops Abdomen:  Soft, nondistended, mild lower abdominal tenderness. Normal bowel sounds. No appreciable masses or hepatomegaly.  No rebound or guarding.  Rectal:  Not performed. Msk:  Symmetrical without gross deformities.  Strength generalized weakness  Extremities:  Without edema, cyanosis or clubbing. Neurologic:  Alert and oriented x3;  grossly normal neurologically. Skin:  Intact without significant lesions or rashes. Psych:  Alert and cooperative. Normal affect.  LAB RESULTS: CBC Latest Ref Rng & Units 08/01/2017 08/01/2017 07/26/2017  WBC 3.8 - 10.6 K/uL 8.0 8.5 8.3  Hemoglobin 13.0 - 18.0 g/dL 12.8(L) 13.6 11.2(L)  Hematocrit 40.0 - 52.0 % 36.7(L) 38.9(L) 32.0(L)  Platelets 150 - 440 K/uL 317 328 191    BMET BMP Latest Ref Rng & Units 08/01/2017 08/01/2017 07/28/2017  Glucose 65 - 99 mg/dL - 117(H) -  BUN 6 - 20 mg/dL - 11 -  Creatinine 0.61 - 1.24 mg/dL 1.12 1.13 -  Sodium 135 - 145 mmol/L - 124(L) 131(L)  Potassium 3.5 - 5.1 mmol/L - 4.9 -  Chloride 101 - 111 mmol/L - 94(L) -  CO2 22 - 32 mmol/L - 23 -  Calcium 8.9 - 10.3 mg/dL - 8.6(L) -    LFT Hepatic Function Latest Ref Rng & Units 08/01/2017 07/23/2017  Total Protein 6.5 - 8.1 g/dL 6.8 7.1  Albumin 3.5 - 5.0 g/dL 3.4(L) 3.6  AST 15 - 41 U/L 46(H) 21  ALT 17 - 63 U/L 14(L) 11(L)  Alk Phosphatase 38 - 126 U/L 49 52  Total Bilirubin 0.3 - 1.2 mg/dL 1.0 1.0      STUDIES: Ct Abdomen Pelvis W Contrast  Result Date: 08/01/2017 CLINICAL DATA:  Bilateral lower abdominal pain, vomiting and diarrhea. EXAM: CT ABDOMEN AND PELVIS WITH CONTRAST TECHNIQUE: Multidetector CT imaging of the abdomen and pelvis was performed using the standard protocol following bolus administration of intravenous contrast. CONTRAST:  86m ISOVUE-300 IOPAMIDOL (ISOVUE-300) INJECTION 61% COMPARISON:  Chest, abdomen pelvis CT dated 07/24/2017. FINDINGS: Lower chest: 5 mm left lower lobe nodule on image number 5 of series 4 with a slight decrease in size and density. Hepatobiliary: Minimal diffuse low density of the liver relative to the spleen. Normal appearing gallbladder. Pancreas: Unremarkable. No pancreatic ductal dilatation or surrounding inflammatory changes. Spleen: Normal in size without focal abnormality. Adrenals/Urinary Tract: Small exophytic upper pole right renal cyst. Unremarkable  left kidney, adrenal glands, ureters and urinary bladder. Stomach/Bowel: Surgically absent appendix. Unremarkable stomach, small bowel and colon. Vascular/Lymphatic: Atheromatous arterial calcifications without aneurysm. No enlarged lymph nodes. Reproductive: Normal sized prostate gland containing coarse calcifications. Other: Bilateral inguinal hernias containing fat. Musculoskeletal: Bilateral hip degenerative changes. Mild lumbar and lower thoracic spine degenerative changes. IMPRESSION: 1. No acute abnormality. 2. Minimal diffuse hepatic steatosis. Electronically Signed   By: Claudie Revering M.D.   On: 08/01/2017 14:29   Dg Hip Unilat With Pelvis 2-3 Views Left  Result Date: 08/01/2017 CLINICAL DATA:  Left groin pain EXAM: DG HIP (WITH OR WITHOUT PELVIS) 2-3V LEFT COMPARISON:  None. FINDINGS: Pelvic ring is intact. Degenerative changes of the hip joints are noted bilaterally. No acute fracture or dislocation is seen. No soft tissue changes are noted. IMPRESSION: Degenerative change without acute  abnormality. Electronically Signed   By: Inez Catalina M.D.   On: 08/01/2017 16:06   Dg Hip Unilat With Pelvis 2-3 Views Right  Result Date: 08/01/2017 CLINICAL DATA:  Right groin pain today.  Fell 1 week ago. EXAM: DG HIP (WITH OR WITHOUT PELVIS) 2-3V RIGHT COMPARISON:  Pelvis CT obtained earlier today. FINDINGS: Mild bilateral hip degenerative changes. No fracture or dislocation seen. Diffuse osteopenia. Excreted contrast in the urinary bladder. IMPRESSION: 1. No fracture or dislocation. 2. Mild bilateral hip degenerative changes. Electronically Signed   By: Claudie Revering M.D.   On: 08/01/2017 16:07      Impression / Plan:   ERNST CUMPSTON is a 82 y.o. Caucasian male with history of diabetes, hypertension, hyperlipidemia with recent hospitalization after episode of severe diarrhea that resulted in dehydration, acute kidney injury and syncope. He was incidentally found to have right middle lobe pneumonia and mild enteritis. Empirically treated with Cipro. For now, admitted with lower abdominal pain and worsening of diarrhea, nausea. His history is highly suggestive of C. Difficile colits  - Stool studies to rule out infection, C. Difficile and other GI pathogens - okay with clear liquid diet - if there is no evidence of infectious diarrhea, recommend colonoscopy for further evaluation - do not recommend empiric antibiotics at this time  Thank you for involving me in the care of this patient.      LOS: 0 days   Sherri Sear, MD  08/01/2017, 6:05 PM   Note: This dictation was prepared with Dragon dictation along with smaller phrase technology. Any transcriptional errors that result from this process are unintentional.

## 2017-08-01 NOTE — H&P (Signed)
[facility] NAME: Richard Acosta    MR#:  [id number]  DATE OF BIRTH:  1935/11/24  DATE OF ADMISSION:  08/01/2017  PRIMARY CARE PHYSICIAN: Baxter Hire, MD   REQUESTING/REFERRING PHYSICIAN:   CHIEF COMPLAINT:   Chief Complaint  Patient presents with  . Emesis  . Abdominal Pain  . Diarrhea    HISTORY OF PRESENT ILLNESS: Richard Acosta  is a 82 y.o. male with a known history per below, recently discharged from the hospital on July 28, 2017 for acute renal failure/orthostatic hypotension due to poor p.o. intake/acute enteritis with diarrhea-treated with Cipro/Flagyl for 7 days/atypical pneumonia, presenting today with nausea, continued diarrhea, bilateral groin/upper thigh pain, decreased p.o. intake, in the emergency room patient was found to have sodium 124 down from 131 previously, chloride 94, lipase 76, AST 46, UA negative, CT abdomen noted for hepatic steatosis, patient evaluated in the emergency room with family present, patient is now being admitted for acute recurrent diarrhea-?  Medication side effect from metformin, acute bilateral groin/upper thigh pain, acute recurrent hyponatremia.  PAST MEDICAL HISTORY:   Past Medical History:  Diagnosis Date  . Diabetes mellitus without complication ([place])   . Hyperlipidemia   . Hypertension   . Hypothyroidism     PAST SURGICAL HISTORY:  Past Surgical History:  Procedure Laterality Date  . APPENDECTOMY      SOCIAL HISTORY:  Social History   Tobacco Use  . Smoking status: Former Research scientist (life sciences)  . Smokeless tobacco: Never Used  . Tobacco comment: quit about 50 years ago  Substance Use Topics  . Alcohol use: Not Currently    Alcohol/week: 1.2 oz    Types: 2 Glasses of wine per week    Frequency: Never    FAMILY HISTORY:  Family History  Problem Relation Age of Onset  . Diabetes Mother   . Diabetes Father     DRUG ALLERGIES: No Known Allergies  REVIEW OF SYSTEMS:    CONSTITUTIONAL: No fever, +fatigue / weakness.  EYES: No blurred or double vision.  EARS, NOSE, AND THROAT: No tinnitus or ear pain.  RESPIRATORY: No cough, shortness of breath, wheezing or hemoptysis.  CARDIOVASCULAR: No chest pain, orthopnea, edema.  GASTROINTESTINAL: + nausea, vomiting, diarrhea  GENITOURINARY: No dysuria, hematuria.  ENDOCRINE: No polyuria, nocturia,  HEMATOLOGY: No anemia, easy bruising or bleeding SKIN: No rash or lesion. MUSCULOSKELETAL: No joint pain or arthritis.  Bilateral groin/upper thigh pain NEUROLOGIC: No tingling, numbness, weakness.  PSYCHIATRY: No anxiety or depression.   MEDICATIONS AT HOME:  Prior to Admission medications   Medication Sig Start Date End Date Taking? Authorizing Provider  ezetimibe-simvastatin (VYTORIN) 10-40 MG tablet Take 1 tablet by mouth daily.   Yes [provider]  feeding supplement, ENSURE ENLIVE, (ENSURE ENLIVE) LIQD Take 237 mLs by mouth 2 (two) times daily between meals. 07/28/17  Yes Fritzi Mandes, MD  levothyroxine (SYNTHROID, LEVOTHROID) 50 MCG tablet Take 50 mcg by mouth daily before breakfast.   Yes [provider]  lisinopril (PRINIVIL,ZESTRIL) 20 MG tablet Take 20 mg by mouth daily. 07/15/17  Yes [provider]  metFORMIN (GLUCOPHAGE) 1000 MG tablet Take 1,000 mg by mouth daily with breakfast.   Yes [provider]  Multiple Vitamin (MULTIVITAMIN WITH MINERALS) TABS tablet Take 1 tablet by mouth daily. 07/29/17  Yes Fritzi Mandes, MD  sodium chloride 1 g tablet Take 1 tablet (1 g total) by mouth 2 (two) times daily with a meal. 07/29/17  Yes Fritzi Mandes, MD  tamsulosin (FLOMAX) 0.4 MG CAPS capsule Take 0.4 mg by mouth daily after supper.   Yes [provider]  timolol (BETIMOL) 0.5 % ophthalmic solution Place 1 drop into both eyes at bedtime.    Yes [provider]      PHYSICAL EXAMINATION:   VITAL SIGNS: Blood pressure (!) 155/80, pulse 72, temperature 97.8 F  (36.6 C), temperature source Oral, resp. rate 16, height 5\' 6"  (1.676 m), weight 72.6 kg (160 lb), SpO2 100 %.  GENERAL:  82 y.o.-year-old patient lying in the bed with no acute distress.  Frail appearing EYES: Pupils equal, round, reactive to light and accommodation. No scleral icterus. Extraocular muscles intact.  HEENT: Head atraumatic, normocephalic. Oropharynx and nasopharynx clear.  NECK:  Supple, no jugular venous distention. No thyroid enlargement, no tenderness.  LUNGS: Normal breath sounds bilaterally, no wheezing, rales,rhonchi or crepitation. No use of accessory muscles of respiration.  CARDIOVASCULAR: S1, S2 normal. No murmurs, rubs, or gallops.  ABDOMEN: Soft, nontender, nondistended. Bowel sounds present. No organomegaly or mass.  EXTREMITIES: No pedal edema, cyanosis, or clubbing.  Decreased range of motion of bilateral lower extremities with flexion internal rotation due to pain NEUROLOGIC: Cranial nerves II through XII are intact. MAES. Gait not checked.  PSYCHIATRIC: The patient is alert and oriented x 3.  SKIN: No obvious rash, lesion, or ulcer.   LABORATORY PANEL:   CBC Recent Labs  Lab 07/26/17 0358 08/01/17 1329  WBC 8.3 8.5  HGB 11.2* 13.6  HCT 32.0* 38.9*  PLT 191 328  MCV 86.6 85.5  MCH 30.4 29.9  MCHC 35.1 34.9  RDW 13.1 13.3   ------------------------------------------------------------------------------------------------------------------  Chemistries  Recent Labs  Lab 07/26/17 0358 07/27/17 0809 07/27/17 2011 07/28/17 0510 08/01/17 1329  NA 126* 127* 131* 131* 124*  K 4.1 4.1  --   --  4.9  CL 97* 100*  --   --  94*  CO2 24 21*  --   --  23  GLUCOSE 121* 101*  --   --  117*  BUN 19 25*  --   --  11  CREATININE 1.76* 1.56*  --   --  1.13  CALCIUM 7.8* 7.6*  --   --  8.6*  AST  --   --   --   --  46*  ALT  --   --   --   --  14*  ALKPHOS  --   --   --   --  49  BILITOT  --   --   --   --  1.0    ------------------------------------------------------------------------------------------------------------------ estimated creatinine clearance is 47.8 mL/min (by C-G formula based on SCr of 1.13 mg/dL). ------------------------------------------------------------------------------------------------------------------ No results for input(s): TSH, T4TOTAL, T3FREE, THYROIDAB in the last 72 hours.  Invalid input(s): FREET3   Coagulation profile No results for input(s): INR, PROTIME in the last 168 hours. ------------------------------------------------------------------------------------------------------------------- No results for input(s): DDIMER in the last 72 hours. -------------------------------------------------------------------------------------------------------------------  Cardiac Enzymes No results for input(s): CKMB, TROPONINI, MYOGLOBIN in the last 168 hours.  Invalid input(s): CK ------------------------------------------------------------------------------------------------------------------ Invalid input(s): POCBNP  ---------------------------------------------------------------------------------------------------------------  Urinalysis    Component Value Date/Time   COLORURINE STRAW (A) 08/01/2017 1329   APPEARANCEUR CLEAR (A) 08/01/2017 1329   LABSPEC 1.005 08/01/2017 1329   PHURINE 8.0 08/01/2017 1329   GLUCOSEU NEGATIVE 08/01/2017 1329   HGBUR NEGATIVE 08/01/2017 1329   BILIRUBINUR NEGATIVE 08/01/2017 1329   KETONESUR NEGATIVE 08/01/2017 1329   PROTEINUR NEGATIVE 08/01/2017  [place] 08/01/2017 1329   LEUKOCYTESUR NEGATIVE 08/01/2017 1329     RADIOLOGY: Ct Abdomen Pelvis W Contrast  Result Date: 08/01/2017 CLINICAL DATA:  Bilateral lower abdominal pain, vomiting and diarrhea. EXAM: CT ABDOMEN AND PELVIS WITH CONTRAST TECHNIQUE: Multidetector CT imaging of the abdomen and pelvis was performed using the standard protocol following  bolus administration of intravenous contrast. CONTRAST:  5mL ISOVUE-300 IOPAMIDOL (ISOVUE-300) INJECTION 61% COMPARISON:  Chest, abdomen pelvis CT dated 07/24/2017. FINDINGS: Lower chest: 5 mm left lower lobe nodule on image number 5 of series 4 with a slight decrease in size and density. Hepatobiliary: Minimal diffuse low density of the liver relative to the spleen. Normal appearing gallbladder. Pancreas: Unremarkable. No pancreatic ductal dilatation or surrounding inflammatory changes. Spleen: Normal in size without focal abnormality. Adrenals/Urinary Tract: Small exophytic upper pole right renal cyst. Unremarkable left kidney, adrenal glands, ureters and urinary bladder. Stomach/Bowel: Surgically absent appendix. Unremarkable stomach, small bowel and colon. Vascular/Lymphatic: Atheromatous arterial calcifications without aneurysm. No enlarged lymph nodes. Reproductive: Normal sized prostate gland containing coarse calcifications. Other: Bilateral inguinal hernias containing fat. Musculoskeletal: Bilateral hip degenerative changes. Mild lumbar and lower thoracic spine degenerative changes. IMPRESSION: 1. No acute abnormality. 2. Minimal diffuse hepatic steatosis. Electronically Signed   By: Claudie Revering M.D.   On: 08/01/2017 14:29    EKG: Orders placed or performed during the hospital encounter of 07/23/17  . EKG 12-Lead  . EKG 12-Lead  . ED EKG  . ED EKG    IMPRESSION AND PLAN: 1 acute recurrent diarrhea with nausea/emesis Presented to the hospital recently for similar symptom complex, treated for acute enteritis with Cipro/Flagyl-discharged July 28 2017  ?  Secondary to metformin use Admit to regular nursing floor bed, IV fluids for rehydration, clear liquid diet for now, hold metformin, check lactic acid level, sed rate, GI panel, gastroenterology to see-last endoscopy was greater than 10 years ago with sigmoidoscopy, check urine drug screen, CMP in the morning, hepatitis panel, liver  ultrasound given abnormal CT noted for hepatic steatosis, check TSH  2 acute recurrent hyponatremia Most likely secondary to #1 IV fluids for rehydration, salt tabs, BMP in the morning  3 acute bilateral groin/upper thigh pain Worse with flexion/internal rotation, no external deformities noted Etiology unknown Hold statin therapy, check CPK level, check pelvis and bilateral hip x-rays, adult pain protocol, and continue close medical monitoring CT abdomen noted above, no evidence for femoral hernia/inguinal hernia on my exam  4 chronic diabetes mellitus type 2 Sliding scale insulin with Accu-Cheks per routine, hold metformin as stated above given diarrhea  5 chronic hypothyroidism, unspecified Continue Synthroid check TSH level  6 chronic hyperlipidemia, unspecified CT abdomen noted for hepatic steatosis, elevated AST level Check liver ultrasound, hepatitis panel, hold statin therapy for now   All the records are reviewed and case discussed with ED provider. Management plans discussed with the patient, family and they are in agreement.  CODE STATUS: Code Status History    Date Active Date Inactive Code Status Order ID Comments User Context   07/23/2017 1259 07/28/2017 1549 Full Code [id number]  Gladstone Lighter, MD Inpatient       TOTAL TIME TAKING CARE OF THIS PATIENT: 45 minutes.    Avel Peace Kento Gossman M.D on 08/01/2017   Between 7am to 6pm - Pager - [phone]  After 6pm go to [email or web address] - password [email or web address] [facility]  Office  [phone]  CC: Primary care physician; Baxter Hire, MD  Note: This dictation was prepared with Dragon dictation along with smaller phrase technology. Any transcriptional errors that result from this process are unintentional.

## 2017-08-01 NOTE — Progress Notes (Signed)
Wife, Edd Fabian reports that Hydrocodone causes N/V for pt. I gave him a glucerna instead of Ensure, wife said his blood sugar spiked @ home w/Ensure (DMII).  Urine drug screen sent to lab, no stool yet to send to lab. IV [place] S 139ml hr. Tol clear liq's ok-but he's not taking a lot in yet-told him don't push it since he's had N/V in recent past.

## 2017-08-01 NOTE — ED Triage Notes (Signed)
Pt arrives via ems from home with a couple episodes of vomiting and diarrhea, pt reports some bilat lower abd pain. Recent admission with pneumonia

## 2017-08-01 NOTE — ED Provider Notes (Signed)
[facility] Emergency Department Provider Note   ____________________________________________   First MD Initiated Contact with Patient 08/01/17 1339     (approximate)  I have reviewed the triage vital signs and the nursing notes.   HISTORY  Chief Complaint Emesis; Abdominal Pain; and Diarrhea    HPI Richard Acosta is a 82 y.o. male history of diabetes hypertension recent admission to the hospital for dehydration, possible atypical pneumonia, as well as enteritis  Patient reports for last 4 days since going home he has had no appetite, occasionally able to keep only small sips of water down but any food he tries eat he will throw back up.  He is also had a couple loose stools that were not black or bloody, he took Imodium today and has not had any further loose stools.  Family reports his sodium level was low when he was in the hospital, and it was creeping back up due to dehydration but he was not able to get into his appointment this week to have it rechecked  Denies chest pain.  Denies cough.  No fevers.  Reports a moderate tenderness worse with movement in the lower portions of the abdomen bilaterally.  Denies swelling hernia or scrotal pain.  He has noticed a couple of "water blisters" over the left flank that developed while he was in the hospital.  They have been bandaging these.  Past Medical History:  Diagnosis Date  . Diabetes mellitus without complication ([place])   . Hyperlipidemia   . Hypertension   . Hypothyroidism     Patient Active Problem List   Diagnosis Date Noted  . ARF (acute renal failure) ([place]) 07/23/2017    Past Surgical History:  Procedure Laterality Date  . APPENDECTOMY      Prior to Admission medications   Medication Sig Start Date End Date Taking? Authorizing Provider  ciprofloxacin (CIPRO) 500 MG tablet Take 1 tablet (500 mg total) by mouth 2 (two) times daily. 07/28/17   Fritzi Mandes, MD  ezetimibe-simvastatin  (VYTORIN) 10-40 MG tablet Take 1 tablet by mouth daily.    [provider]  feeding supplement, ENSURE ENLIVE, (ENSURE ENLIVE) LIQD Take 237 mLs by mouth 2 (two) times daily between meals. 07/28/17   Fritzi Mandes, MD  levothyroxine (SYNTHROID, LEVOTHROID) 50 MCG tablet Take 50 mcg by mouth daily before breakfast.    [provider]  metFORMIN (GLUCOPHAGE) 1000 MG tablet Take 1,000 mg by mouth daily with breakfast.    [provider]  Multiple Vitamin (MULTIVITAMIN WITH MINERALS) TABS tablet Take 1 tablet by mouth daily. 07/29/17   Fritzi Mandes, MD  sodium chloride 1 g tablet Take 1 tablet (1 g total) by mouth 2 (two) times daily with a meal. 07/29/17   Fritzi Mandes, MD  tamsulosin (FLOMAX) 0.4 MG CAPS capsule Take 0.4 mg by mouth daily after supper.    [provider]  timolol (BETIMOL) 0.5 % ophthalmic solution Place 1 drop into both eyes daily.    [provider]    Allergies Patient has no known allergies.  Family History  Problem Relation Age of Onset  . Diabetes Mother   . Diabetes Father     Social History Social History   Tobacco Use  . Smoking status: Former Research scientist (life sciences)  . Smokeless tobacco: Never Used  . Tobacco comment: quit about 50 years ago  Substance Use Topics  . Alcohol use: Not Currently    Alcohol/week: 1.2 oz    Types: 2  Glasses of wine per week    Frequency: Never  . Drug use: Not Currently    Review of Systems Constitutional: No fever/chills but feels fatigued and dehydrated Eyes: No visual changes. ENT: No sore throat. Cardiovascular: Denies chest pain. Respiratory: Denies shortness of breath. Gastrointestinal:   No constipation. Genitourinary: Negative for dysuria. Musculoskeletal: Negative for back pain. Skin: Negative for rash. Neurological: Negative for headaches, focal weakness or numbness.    ____________________________________________   PHYSICAL EXAM:  VITAL SIGNS: ED Triage Vitals  Enc Vitals Group      BP 08/01/17 1319 (!) 151/81     Pulse Rate 08/01/17 1319 72     Resp 08/01/17 1319 16     Temp 08/01/17 1319 97.8 F (36.6 C)     Temp Source 08/01/17 1319 Oral     SpO2 08/01/17 1319 100 %     Weight 08/01/17 1320 160 lb (72.6 kg)     Height 08/01/17 1320 5\' 6"  (1.676 m)     Head Circumference --      Peak Flow --      Pain Score 08/01/17 1319 6     Pain Loc --      Pain Edu? --      Excl. in [place]? --     Constitutional: Alert and oriented.  Mild to moderately ill-appearing, very pleasant and in no distress. Eyes: Conjunctivae are normal. Head: Atraumatic. Nose: No congestion/rhinnorhea. Mouth/Throat: Mucous membranes are notably dry Neck: No stridor.   Cardiovascular: Normal rate, regular rhythm. Grossly normal heart sounds.  Good peripheral circulation. Respiratory: Normal respiratory effort.  No retractions. Lungs CTAB. Gastrointestinal: Soft and moderately tender throughout the lower abdomen with some rebound tenderness in the left lower quadrant. No distention. Musculoskeletal: No lower extremity tenderness nor edema. Neurologic:  Normal speech and language. No gross focal neurologic deficits are appreciated.  Skin:  Skin is warm, dry and intact. No rash noted. Psychiatric: Mood and affect are normal. Speech and behavior are normal.  ____________________________________________   LABS (all labs ordered are listed, but only abnormal results are displayed)  Labs Reviewed  LIPASE, BLOOD - Abnormal; Notable for the following components:      Result Value   Lipase 76 (*)    All other components within normal limits  COMPREHENSIVE METABOLIC PANEL - Abnormal; Notable for the following components:   Sodium 124 (*)    Chloride 94 (*)    Glucose, Bld 117 (*)    Calcium 8.6 (*)    Albumin 3.4 (*)    AST 46 (*)    ALT 14 (*)    GFR calc non Af Amer 60 (*)    All other components within normal limits  CBC - Abnormal; Notable for the following components:   HCT 38.9 (*)     All other components within normal limits  URINALYSIS, COMPLETE (UACMP) WITH MICROSCOPIC - Abnormal; Notable for the following components:   Color, Urine STRAW (*)    APPearance CLEAR (*)    All other components within normal limits  GASTROINTESTINAL PANEL BY PCR, STOOL (REPLACES STOOL CULTURE)  C DIFFICILE QUICK SCREEN W PCR REFLEX   ____________________________________________  EKG   ____________________________________________  RADIOLOGY  Ct Abdomen Pelvis W Contrast  Result Date: 08/01/2017 CLINICAL DATA:  Bilateral lower abdominal pain, vomiting and diarrhea. EXAM: CT ABDOMEN AND PELVIS WITH CONTRAST TECHNIQUE: Multidetector CT imaging of the abdomen and pelvis was performed using the standard protocol following bolus administration of intravenous contrast. CONTRAST:  38mL  ISOVUE-300 IOPAMIDOL (ISOVUE-300) INJECTION 61% COMPARISON:  Chest, abdomen pelvis CT dated 07/24/2017. FINDINGS: Lower chest: 5 mm left lower lobe nodule on image number 5 of series 4 with a slight decrease in size and density. Hepatobiliary: Minimal diffuse low density of the liver relative to the spleen. Normal appearing gallbladder. Pancreas: Unremarkable. No pancreatic ductal dilatation or surrounding inflammatory changes. Spleen: Normal in size without focal abnormality. Adrenals/Urinary Tract: Small exophytic upper pole right renal cyst. Unremarkable left kidney, adrenal glands, ureters and urinary bladder. Stomach/Bowel: Surgically absent appendix. Unremarkable stomach, small bowel and colon. Vascular/Lymphatic: Atheromatous arterial calcifications without aneurysm. No enlarged lymph nodes. Reproductive: Normal sized prostate gland containing coarse calcifications. Other: Bilateral inguinal hernias containing fat. Musculoskeletal: Bilateral hip degenerative changes. Mild lumbar and lower thoracic spine degenerative changes. IMPRESSION: 1. No acute abnormality. 2. Minimal diffuse hepatic steatosis.  Electronically Signed   By: Claudie Revering M.D.   On: 08/01/2017 14:29    ____________________________________________   PROCEDURES  Procedure(s) performed: None  Procedures  Critical Care performed: No  ____________________________________________   INITIAL IMPRESSION / ASSESSMENT AND PLAN / ED COURSE  Pertinent labs & imaging results that were available during my care of the patient were reviewed by me and considered in my medical decision making (see chart for details).  Differential diagnosis includes but is not limited to, abdominal perforation, aortic dissection, cholecystitis, appendicitis, diverticulitis, colitis, esophagitis/gastritis, kidney stone, pyelonephritis, urinary tract infection, aortic aneurysm. All are considered in decision and treatment plan. Based upon the patient's presentation and risk factors, concerned about the peritonitis with associated diarrhea and vomiting.  The patient was treated with antibiotics for possible colitis at discharge, but continues to have increasing symptoms.  We will have her begin light hydration, check sodium and electrolytes, also obtain CT scan to further evaluate for possible worsening infectious or inflammatory process in the abdomen particularly across the low abdomen based on clinical exam.  ----------------------------------------- 2:28 PM on 08/01/2017 -----------------------------------------  Patient resting more comfortably after Zofran and morphine.  Discussed labs including his low sodium, he is receiving a light bolus at this time.  Patient will be admitted to the hospital for ongoing care and workup.  CT scan pending at this time         ____________________________________________   FINAL CLINICAL IMPRESSION(S) / ED DIAGNOSES  Final diagnoses:  Non-intractable vomiting with nausea, unspecified vomiting type  Enteritis  Hyponatremia      NEW MEDICATIONS STARTED DURING THIS VISIT:  New Prescriptions    No medications on file     Note:  This document was prepared using Dragon voice recognition software and may include unintentional dictation errors.     Delman Kitten, MD 08/01/17 1433

## 2017-08-02 LAB — BASIC METABOLIC PANEL
ANION GAP: 4 — AB (ref 5–15)
BUN: 12 mg/dL (ref 6–20)
CHLORIDE: 101 mmol/L (ref 101–111)
CO2: 25 mmol/L (ref 22–32)
Calcium: 8.1 mg/dL — ABNORMAL LOW (ref 8.9–10.3)
Creatinine, Ser: 1.4 mg/dL — ABNORMAL HIGH (ref 0.61–1.24)
GFR calc Af Amer: 54 mL/min — ABNORMAL LOW (ref >60)
GFR calc non Af Amer: 46 mL/min — ABNORMAL LOW (ref >60)
Glucose, Bld: 97 mg/dL (ref 65–99)
POTASSIUM: 4.4 mmol/L (ref 3.5–5.1)
SODIUM: 130 mmol/L — AB (ref 135–145)

## 2017-08-02 LAB — HEPATITIS PANEL, ACUTE
HCV Ab: <0.1 s/co ratio (ref 0.0–0.9)
HEP B S AG: NEGATIVE
Hep A IgM: NEGATIVE
Hep B C IgM: NEGATIVE

## 2017-08-02 LAB — GLUCOSE, CAPILLARY
GLUCOSE-CAPILLARY: 86 mg/dL (ref 65–99)
Glucose-Capillary: 129 mg/dL — ABNORMAL HIGH (ref 65–99)

## 2017-08-02 MED ORDER — ENSURE ENLIVE PO LIQD
237.0000 mL | Freq: Two times a day (BID) | ORAL | Status: DC
  Administered 2017-08-02: 237 mL via ORAL

## 2017-08-02 MED ORDER — MELOXICAM 15 MG PO TABS: 15.0000 mg | ORAL_TABLET | Freq: Every day | ORAL | 0 refills | Status: AC

## 2017-08-02 MED ORDER — GLUCERNA PO LIQD
237.0000 mL | Freq: Two times a day (BID) | ORAL | Status: DC
  Administered 2017-08-02: 237 mL via ORAL
  Filled 2017-08-02: qty 237

## 2017-08-02 MED ORDER — SODIUM CHLORIDE 1 G PO TABS: 1.0000 g | ORAL_TABLET | Freq: Two times a day (BID) | ORAL | 0 refills | Status: AC

## 2017-08-02 NOTE — Progress Notes (Signed)
Tolerating supper w/out N/V, home soon w/family. Understands d/c instr. Has Rx for 1. Mobic, 2. Nacl tabs.

## 2017-08-02 NOTE — Progress Notes (Signed)
Richard Darby, MD [address]  [place]  [place], [place] [address]  Main: [phone]  Fax: [phone] Pager: [phone]   Subjective: He did not have bowel movement since admission. Reports passing a lot of gas. Denies nausea or vomiting or abdominal pain. Able to tolerate some food by mouth. Serum sodium levels are improving   Objective: Vital signs in last 24 hours: Vitals:   08/01/17 1430 08/01/17 1500 08/01/17 2017 08/02/17 0459  BP: (!) 152/80 (!) 162/73 130/73 104/70  Pulse: 66 70 73 (!) 59  Resp:   20 16  Temp:   97.9 F (36.6 C) 98.1 F (36.7 C)  TempSrc:   Oral Oral  SpO2: 97% 98% 100% 100%  Weight:    162 lb 11.2 oz (73.8 kg)  Height:       Weight change:   Intake/Output Summary (Last 24 hours) at 08/02/2017 1722 Last data filed at 08/02/2017 1100 Gross per 24 hour  Intake 1348.33 ml  Output 725 ml  Net 623.33 ml     Exam: Heart:: Regular rate and rhythm or S1S2 present Lungs: clear to auscultation Abdomen: soft, nontender, normal bowel sounds   Lab Results: CBC Latest Ref Rng & Units 08/01/2017 08/01/2017 07/26/2017  WBC 3.8 - 10.6 K/uL 8.0 8.5 8.3  Hemoglobin 13.0 - 18.0 g/dL 12.8(L) 13.6 11.2(L)  Hematocrit 40.0 - 52.0 % 36.7(L) 38.9(L) 32.0(L)  Platelets 150 - 440 K/uL 317 328 191   BMP Latest Ref Rng & Units 08/02/2017 08/01/2017 08/01/2017  Glucose 65 - 99 mg/dL 97 - 117(H)  BUN 6 - 20 mg/dL 12 - 11  Creatinine 0.61 - 1.24 mg/dL 1.40(H) 1.12 1.13  Sodium 135 - 145 mmol/L 130(L) - 124(L)  Potassium 3.5 - 5.1 mmol/L 4.4 - 4.9  Chloride 101 - 111 mmol/L 101 - 94(L)  CO2 22 - 32 mmol/L 25 - 23  Calcium 8.9 - 10.3 mg/dL 8.1(L) - 8.6(L)   Micro Results: Recent Results (from the past 240 hour(s))  CULTURE, BLOOD (ROUTINE X 2) w Reflex to ID Panel     Status: None   Collection Time: 07/24/17 11:57 AM  Result Value Ref Range Status   Specimen Description BLOOD LT [facility]  Final   Special Requests   Final    BOTTLES DRAWN AEROBIC AND  ANAEROBIC Blood Culture adequate volume   Culture   Final    NO GROWTH 5 DAYS Performed at [facility], [address] [place], [place] [address]    Report Status 07/29/2017 FINAL  Final  CULTURE, BLOOD (ROUTINE X 2) w Reflex to ID Panel     Status: None   Collection Time: 07/24/17 11:57 AM  Result Value Ref Range Status   Specimen Description BLOOD RT FA  Final   Special Requests   Final    BOTTLES DRAWN AEROBIC AND ANAEROBIC Blood Culture results may not be optimal due to an excessive volume of blood received in culture bottles   Culture   Final    NO GROWTH 5 DAYS Performed at [facility], [address] [place], [place] [address]    Report Status 07/29/2017 FINAL  Final  Culture, expectorated sputum-assessment     Status: None   Collection Time: 07/26/17 12:40 PM  Result Value Ref Range Status   Specimen Description SPUTUM  Final   Special Requests Normal  Final   Sputum evaluation   Final    THIS SPECIMEN IS ACCEPTABLE FOR SPUTUM CULTURE Performed at  [facility], [address] [place], [place] [address]    Report Status 07/26/2017 FINAL  Final  Culture, respiratory (NON-Expectorated)     Status: None   Collection Time: 07/26/17 12:40 PM  Result Value Ref Range Status   Specimen Description   Final    SPUTUM Performed at [facility], [address] [place], [place] [address]    Special Requests   Final    Normal Reflexed from [id number] Performed at [facility], [address] [place], [place] [address]    Gram Stain   Final    FEW WBC PRESENT, PREDOMINANTLY PMN RARE SQUAMOUS EPITHELIAL CELLS PRESENT MODERATE GRAM POSITIVE COCCI IN CLUSTERS    Culture   Final    ABUNDANT Consistent with normal respiratory flora. Performed at [facility], [place] [address] [place], [place] [address]    Report Status 07/29/2017 FINAL  Final   Studies/Results: Ct Abdomen Pelvis W Contrast  Result Date:  08/01/2017 CLINICAL DATA:  Bilateral lower abdominal pain, vomiting and diarrhea. EXAM: CT ABDOMEN AND PELVIS WITH CONTRAST TECHNIQUE: Multidetector CT imaging of the abdomen and pelvis was performed using the standard protocol following bolus administration of intravenous contrast. CONTRAST:  60mL ISOVUE-300 IOPAMIDOL (ISOVUE-300) INJECTION 61% COMPARISON:  Chest, abdomen pelvis CT dated 07/24/2017. FINDINGS: Lower chest: 5 mm left lower lobe nodule on image number 5 of series 4 with a slight decrease in size and density. Hepatobiliary: Minimal diffuse low density of the liver relative to the spleen. Normal appearing gallbladder. Pancreas: Unremarkable. No pancreatic ductal dilatation or surrounding inflammatory changes. Spleen: Normal in size without focal abnormality. Adrenals/Urinary Tract: Small exophytic upper pole right renal cyst. Unremarkable left kidney, adrenal glands, ureters and urinary bladder. Stomach/Bowel: Surgically absent appendix. Unremarkable stomach, small bowel and colon. Vascular/Lymphatic: Atheromatous arterial calcifications without aneurysm. No enlarged lymph nodes. Reproductive: Normal sized prostate gland containing coarse calcifications. Other: Bilateral inguinal hernias containing fat. Musculoskeletal: Bilateral hip degenerative changes. Mild lumbar and lower thoracic spine degenerative changes. IMPRESSION: 1. No acute abnormality. 2. Minimal diffuse hepatic steatosis. Electronically Signed   By: Claudie Revering M.D.   On: 08/01/2017 14:29   US Liver Doppler  Result Date: 08/02/2017 CLINICAL DATA:  82 year old male with elevated liver enzymes EXAM: DUPLEX ULTRASOUND OF LIVER TECHNIQUE: Color and duplex Doppler ultrasound was performed to evaluate the hepatic in-flow and out-flow vessels. COMPARISON:  Concurrently obtained CT scan of the abdomen and pelvis 08/01/2017 FINDINGS: Portal Vein Velocities Main:  33-44 cm/sec with normal hepatopetal flow. Right:  11 cm/sec with normal  hepatopetal flow. Left:  21 cm/sec with normal hepatopetal flow. Hepatic Vein Velocities Right:  35 cm/sec Middle:  20 cm/sec Left:  11 cm/sec Hepatic Artery Velocity:  59 cm/sec Splenic Vein Velocity:  26 cm/sec Varices: None visualized Ascites: None visualized Normal splenic size and volume. Mildly heterogeneous and echogenic appearance of the hepatic parenchyma. IMPRESSION: 1. Widely patent portal veins with normal hepatopetal directional flow. No evidence of portal hypertension or portal thrombosis. 2. Suspect mild hepatic steatosis. Signed, Criselda Peaches, MD [facility] Radiology Electronically Signed   By: Jacqulynn Cadet M.D.   On: 08/02/2017 07:57   Dg Hip Unilat With Pelvis 2-3 Views Left  Result Date: 08/01/2017 CLINICAL DATA:  Left groin pain EXAM: DG HIP (WITH OR WITHOUT PELVIS) 2-3V LEFT COMPARISON:  None. FINDINGS: Pelvic ring is intact. Degenerative changes of the hip joints are noted bilaterally. No acute fracture or dislocation is seen. No  soft tissue changes are noted. IMPRESSION: Degenerative change without acute abnormality. Electronically Signed   By: Inez Catalina M.D.   On: 08/01/2017 16:06   Dg Hip Unilat With Pelvis 2-3 Views Right  Result Date: 08/01/2017 CLINICAL DATA:  Right groin pain today.  Fell 1 week ago. EXAM: DG HIP (WITH OR WITHOUT PELVIS) 2-3V RIGHT COMPARISON:  Pelvis CT obtained earlier today. FINDINGS: Mild bilateral hip degenerative changes. No fracture or dislocation seen. Diffuse osteopenia. Excreted contrast in the urinary bladder. IMPRESSION: 1. No fracture or dislocation. 2. Mild bilateral hip degenerative changes. Electronically Signed   By: Claudie Revering M.D.   On: 08/01/2017 16:07   Medications: I have reviewed the patient's current medications. Scheduled Meds: . feeding supplement (ENSURE ENLIVE)  237 mL Oral BID BM  . heparin  5,000 Units Subcutaneous Q8H  . insulin aspart  0-5 Units Subcutaneous  QHS  . insulin aspart  0-9 Units Subcutaneous TID WC  . lactobacillus  1 g Oral TID WC  . levothyroxine  50 mcg Oral QAC breakfast  . lisinopril  20 mg Oral Daily  . meloxicam  15 mg Oral Daily  . multivitamin with minerals  1 tablet Oral Daily  . sodium chloride  2 g Oral TID  . tamsulosin  0.4 mg Oral QPC supper  . timolol  1 drop Both Eyes QHS   Continuous Infusions: PRN Meds:.acetaminophen **OR** acetaminophen, morphine injection, ondansetron **OR** ondansetron (ZOFRAN) IV, traMADol   Assessment: Active Problems:   Diarrhea hypovolemic hyponatremia No episodes of diarrhea, nausea and vomiting since admission likely antibiotic induced diarrhea versus infectious diarrhea  Plan: Check stool studies to rule out infection if diarrhea recurs Advance diet as tolerated Okay to discharge patient home if able to tolerate by mouth Follow-up in GI clinic if needed Have given patient and his family some dietary tips like trial of probiotics, cut back on lactulose products temporarily, avoid red meat and carbonated beverages  Please call GI back with questions/concerns   LOS: 1 day   Kaitlyn Franko 08/02/2017, 5:22 PM

## 2017-08-02 NOTE — Progress Notes (Signed)
[facility] [facility] [place] NAME: Richard Acosta    MR#:  [id number]  DATE OF BIRTH:  Aug 28, 1935  SUBJECTIVE:   Patient here due to nausea vomiting and diarrhea and also noted to have bilateral groin/upper thigh pain.  Nausea and vomiting and diarrhea much improved.  Afebrile.  Patient still continues to have the groin/upper thigh pain.  Patient's wife and family at bedside.  Sodium level improved with IV fluids.  REVIEW OF SYSTEMS:    Review of Systems  Constitutional: Negative for chills and fever.  HENT: Negative for congestion and tinnitus.   Eyes: Negative for blurred vision and double vision.  Respiratory: Negative for cough, shortness of breath and wheezing.   Cardiovascular: Negative for chest pain, orthopnea and PND.  Gastrointestinal: Negative for abdominal pain, diarrhea, nausea and vomiting.  Genitourinary: Negative for dysuria and hematuria.  Neurological: Negative for dizziness, sensory change and focal weakness.  All other systems reviewed and are negative.   Nutrition: Clear Liquid Tolerating Diet: Yes Tolerating PT: Ambulatory  DRUG ALLERGIES:  No Known Allergies  VITALS:  Blood pressure 104/70, pulse (!) 59, temperature 98.1 F (36.7 C), temperature source Oral, resp. rate 16, height 5\' 6"  (1.676 m), weight 73.8 kg (162 lb 11.2 oz), SpO2 100 %.  PHYSICAL EXAMINATION:   Physical Exam  GENERAL:  82 y.o.-year-old patient lying in bed in no acute distress.  EYES: Pupils equal, round, reactive to light and accommodation. No scleral icterus. Extraocular muscles intact.  HEENT: Head atraumatic, normocephalic. Oropharynx and nasopharynx clear.  NECK:  Supple, no jugular venous distention. No thyroid enlargement, no tenderness.  LUNGS: Normal breath sounds bilaterally, no wheezing, rales, rhonchi. No use of accessory muscles of respiration.  CARDIOVASCULAR: S1, S2 normal. No murmurs, rubs, or gallops.  ABDOMEN: Soft, nontender,  nondistended. Bowel sounds present. No organomegaly or mass.  EXTREMITIES: No cyanosis, clubbing or edema b/l.    NEUROLOGIC: Cranial nerves II through XII are intact. No focal Motor or sensory deficits b/l.   PSYCHIATRIC: The patient is alert and oriented x 3.  SKIN: No obvious rash, lesion, or ulcer.    LABORATORY PANEL:   CBC Recent Labs  Lab 08/01/17 1638  WBC 8.0  HGB 12.8*  HCT 36.7*  PLT 317   ------------------------------------------------------------------------------------------------------------------  Chemistries  Recent Labs  Lab 08/01/17 1329  08/02/17 0441  NA 124*  --  130*  K 4.9  --  4.4  CL 94*  --  101  CO2 23  --  25  GLUCOSE 117*  --  97  BUN 11  --  12  CREATININE 1.13   < > 1.40*  CALCIUM 8.6*  --  8.1*  AST 46*  --   --   ALT 14*  --   --   ALKPHOS 49  --   --   BILITOT 1.0  --   --    < > = values in this interval not displayed.   ------------------------------------------------------------------------------------------------------------------  Cardiac Enzymes No results for input(s): TROPONINI in the last 168 hours. ------------------------------------------------------------------------------------------------------------------  RADIOLOGY:  Ct Abdomen Pelvis W Contrast  Result Date: 08/01/2017 CLINICAL DATA:  Bilateral lower abdominal pain, vomiting and diarrhea. EXAM: CT ABDOMEN AND PELVIS WITH CONTRAST TECHNIQUE: Multidetector CT imaging of the abdomen and pelvis was performed using the standard protocol following bolus administration of intravenous contrast. CONTRAST:  56mL ISOVUE-300 IOPAMIDOL (ISOVUE-300) INJECTION 61% COMPARISON:  Chest, abdomen pelvis CT dated 07/24/2017. FINDINGS: Lower chest: 5 mm left  lower lobe nodule on image number 5 of series 4 with a slight decrease in size and density. Hepatobiliary: Minimal diffuse low density of the liver relative to the spleen. Normal appearing gallbladder. Pancreas: Unremarkable. No  pancreatic ductal dilatation or surrounding inflammatory changes. Spleen: Normal in size without focal abnormality. Adrenals/Urinary Tract: Small exophytic upper pole right renal cyst. Unremarkable left kidney, adrenal glands, ureters and urinary bladder. Stomach/Bowel: Surgically absent appendix. Unremarkable stomach, small bowel and colon. Vascular/Lymphatic: Atheromatous arterial calcifications without aneurysm. No enlarged lymph nodes. Reproductive: Normal sized prostate gland containing coarse calcifications. Other: Bilateral inguinal hernias containing fat. Musculoskeletal: Bilateral hip degenerative changes. Mild lumbar and lower thoracic spine degenerative changes. IMPRESSION: 1. No acute abnormality. 2. Minimal diffuse hepatic steatosis. Electronically Signed   By: Claudie Revering M.D.   On: 08/01/2017 14:29   US Liver Doppler  Result Date: 08/02/2017 CLINICAL DATA:  82 year old male with elevated liver enzymes EXAM: DUPLEX ULTRASOUND OF LIVER TECHNIQUE: Color and duplex Doppler ultrasound was performed to evaluate the hepatic in-flow and out-flow vessels. COMPARISON:  Concurrently obtained CT scan of the abdomen and pelvis 08/01/2017 FINDINGS: Portal Vein Velocities Main:  33-44 cm/sec with normal hepatopetal flow. Right:  11 cm/sec with normal hepatopetal flow. Left:  21 cm/sec with normal hepatopetal flow. Hepatic Vein Velocities Right:  35 cm/sec Middle:  20 cm/sec Left:  11 cm/sec Hepatic Artery Velocity:  59 cm/sec Splenic Vein Velocity:  26 cm/sec Varices: None visualized Ascites: None visualized Normal splenic size and volume. Mildly heterogeneous and echogenic appearance of the hepatic parenchyma. IMPRESSION: 1. Widely patent portal veins with normal hepatopetal directional flow. No evidence of portal hypertension or portal thrombosis. 2. Suspect mild hepatic steatosis. Signed, Criselda Peaches, MD [facility] Electronically Signed    By: Jacqulynn Cadet M.D.   On: 08/02/2017 07:57   Dg Hip Unilat With Pelvis 2-3 Views Left  Result Date: 08/01/2017 CLINICAL DATA:  Left groin pain EXAM: DG HIP (WITH OR WITHOUT PELVIS) 2-3V LEFT COMPARISON:  None. FINDINGS: Pelvic ring is intact. Degenerative changes of the hip joints are noted bilaterally. No acute fracture or dislocation is seen. No soft tissue changes are noted. IMPRESSION: Degenerative change without acute abnormality. Electronically Signed   By: Inez Catalina M.D.   On: 08/01/2017 16:06   Dg Hip Unilat With Pelvis 2-3 Views Right  Result Date: 08/01/2017 CLINICAL DATA:  Right groin pain today.  Fell 1 week ago. EXAM: DG HIP (WITH OR WITHOUT PELVIS) 2-3V RIGHT COMPARISON:  Pelvis CT obtained earlier today. FINDINGS: Mild bilateral hip degenerative changes. No fracture or dislocation seen. Diffuse osteopenia. Excreted contrast in the urinary bladder. IMPRESSION: 1. No fracture or dislocation. 2. Mild bilateral hip degenerative changes. Electronically Signed   By: Claudie Revering M.D.   On: 08/01/2017 16:07     ASSESSMENT AND PLAN:   82 year old male with past medical history of essential hypertension, hyperlipidemia, hypothyroidism, diabetes who presented to the hospital due to nausea vomiting abdominal pain and diarrhea and also noted to be hyponatremic.  Patient also complains of bilateral groin/upper thigh pain.  1. acute recurrent diarrhea with nausea/emesis - Presented to the hospital recently for similar symptom complex, treated for acute enteritis with Cipro/Flagyl-discharged July 28 2017.  Etiology unclear presently.  Seen by gastroenterology and they suspect this was C. difficile but patient has had no further nausea vomiting and diarrhea overnight.  Hold off on antibiotics. -Tolerating clear liquid diet well.  Will advance to a soft  diet and see how he tolerates it.  Discussed with gastroenterology will agree with this management.  Patient's liver ultrasound was  negative for portal vein thrombosis or any portal hypertension.  2. acute recurrent hyponatremia-secondary to poor p.o. intake and also now with ongoing recurrent diarrhea. -Patient's sodium has improved with IV fluid hydration.  We will continue to monitor.  Consider discharging the patient back on salt tablets.  3. acute bilateral groin/upper thigh pain - Worse with flexion/internal rotation, no external deformities noted -Patient's CPK level was normal.  X-rays of his pelvis and hips are also negative.  Continue supportive care with pain control.  Follow-up with primary care physician as outpatient.  4. chronic diabetes mellitus type 2 -Blood sugar stable.  Continue sliding scale insulin.  5.   Hypothyroidism-continue Synthroid.  6.  Essential hypertension-continue lisinopril  7.  BPH-continue Flomax.  8.  Glaucoma-continue timolol eyedrops.  Tolerating soft diet well today then possible discharge home later with follow-up with gastroenterology as outpatient.   All the records are reviewed and case discussed with Care Management/Social Worker Management plans discussed with the patient, family and they are in agreement.  CODE STATUS: Full code  DVT Prophylaxis: Hep SQ  TOTAL TIME TAKING CARE OF THIS PATIENT: 30 minutes.   POSSIBLE D/C IN 1-2 DAYS, DEPENDING ON CLINICAL CONDITION.   Henreitta Leber M.D on 08/02/2017 at 11:27 AM  Between 7am to 6pm - Pager - [phone]  After 6pm go to [email or web address] - Technical brewer [facility]  Office  [phone]  CC: Primary care physician; Lin Landsman, MD

## 2017-08-03 NOTE — Discharge Summary (Signed)
[facility] at [place] NAME: Richard Acosta    MR#:  [id number]  DATE OF BIRTH:  1936/02/14  DATE OF ADMISSION:  08/01/2017 ADMITTING PHYSICIAN: Gorden Harms, MD  DATE OF DISCHARGE: 08/02/2017  6:21 PM  PRIMARY CARE PHYSICIAN: Lin Landsman, MD    ADMISSION DIAGNOSIS:  Enteritis [K52.9] Hyponatremia [E87.1] Groin pain [R10.30] Non-intractable vomiting with nausea, unspecified vomiting type [R11.2]  DISCHARGE DIAGNOSIS:  Active Problems:   Diarrhea   SECONDARY DIAGNOSIS:   Past Medical History:  Diagnosis Date  . Diabetes mellitus without complication ([place])   . Hyperlipidemia   . Hypertension   . Hypothyroidism     HOSPITAL COURSE:   82 year old male with past medical history of essential hypertension, hyperlipidemia, hypothyroidism, diabetes who presented to the hospital due to nausea vomiting abdominal pain and diarrhea and also noted to be hyponatremic.  Patient also complains of bilateral groin/upper thigh pain.  1.acute recurrent diarrhea with nausea/emesis - Presented to the hospital recently for similar symptom complex, treated for acute enteritis with Cipro/Flagyl-dischargedApril 16 2019. Seen by gastroenterology and they suspect this was C. difficile but patient has had no further nausea vomiting and diarrhea while in the hospital.  -Patient's diet was advanced slowly from a clear to a soft diet which she is tolerating without any further nausea vomiting or diarrhea.  Patient is therefore being discharged home with follow-up with gastroenterology as an outpatient.  2. acute recurrent hyponatremia- this was secondary to poor p.o. intake and also now with ongoing recurrent diarrhea. -Patient's sodium has improved with IV fluids and pt. Was discharged on some Oral Salt tabs due to his poor PO intake.   3.acute bilateral groin/upper thigh pain - Worse with flexion/internal rotation, no external deformities  noted -Patient's CPK level was normal.  X-rays of his pelvis and hips are also negative.  Continue supportive care with pain control.  Follow-up with primary care physician as outpatient.  4.chronic diabetes mellitus type 2-while in the hospital patient was on sliding scale insulin, but will resume his metformin upon discharge.  5.  Hypothyroidism- pt. Will continue Synthroid.  6.  Essential hypertension- p.t will continue lisinopril  7.  BPH- pt. Will continue Flomax.  8.  Glaucoma-pt. Will continue timolol eyedrops    DISCHARGE CONDITIONS:   Stable.   CONSULTS OBTAINED:    DRUG ALLERGIES:  No Known Allergies  DISCHARGE MEDICATIONS:   Allergies as of 08/02/2017   No Known Allergies     Medication List    TAKE these medications   ezetimibe-simvastatin 10-40 MG tablet Commonly known as:  VYTORIN Take 1 tablet by mouth daily.   feeding supplement (ENSURE ENLIVE) Liqd Take 237 mLs by mouth 2 (two) times daily between meals.   levothyroxine 50 MCG tablet Commonly known as:  SYNTHROID, LEVOTHROID Take 50 mcg by mouth daily before breakfast.   lisinopril 20 MG tablet Commonly known as:  PRINIVIL,ZESTRIL Take 20 mg by mouth daily.   meloxicam 15 MG tablet Commonly known as:  MOBIC Take 1 tablet (15 mg total) by mouth daily.   metFORMIN 1000 MG tablet Commonly known as:  GLUCOPHAGE Take 1,000 mg by mouth daily with breakfast.   multivitamin with minerals Tabs tablet Take 1 tablet by mouth daily.   sodium chloride 1 g tablet Take 1 tablet (1 g total) by mouth 2 (two) times daily with a meal.   tamsulosin 0.4 MG Caps capsule Commonly known as:  FLOMAX Take 0.4 mg  by mouth daily after supper.   timolol 0.5 % ophthalmic solution Commonly known as:  BETIMOL Place 1 drop into both eyes at bedtime.         DISCHARGE INSTRUCTIONS:   DIET:  Cardiac diet and Diabetic diet  DISCHARGE CONDITION:  Stable  ACTIVITY:  Activity as  tolerated  OXYGEN:  Home Oxygen: No.   Oxygen Delivery: room air  DISCHARGE LOCATION:  home   If you experience worsening of your admission symptoms, develop shortness of breath, life threatening emergency, suicidal or homicidal thoughts you must seek medical attention immediately by calling 911 or calling your MD immediately  if symptoms less severe.  You Must read complete instructions/literature along with all the possible adverse reactions/side effects for all the Medicines you take and that have been prescribed to you. Take any new Medicines after you have completely understood and accpet all the possible adverse reactions/side effects.   Please note  You were cared for by a hospitalist during your hospital stay. If you have any questions about your discharge medications or the care you received while you were in the hospital after you are discharged, you can call the unit and asked to speak with the hospitalist on call if the hospitalist that took care of you is not available. Once you are discharged, your primary care physician will handle any further medical issues. Please note that NO REFILLS for any discharge medications will be authorized once you are discharged, as it is imperative that you return to your primary care physician (or establish a relationship with a primary care physician if you do not have one) for your aftercare needs so that they can reassess your need for medications and monitor your lab values.   DATA REVIEW:   CBC Recent Labs  Lab 08/01/17 1638  WBC 8.0  HGB 12.8*  HCT 36.7*  PLT 317    Chemistries  Recent Labs  Lab 08/01/17 1329  08/02/17 0441  NA 124*  --  130*  K 4.9  --  4.4  CL 94*  --  101  CO2 23  --  25  GLUCOSE 117*  --  97  BUN 11  --  12  CREATININE 1.13   < > 1.40*  CALCIUM 8.6*  --  8.1*  AST 46*  --   --   ALT 14*  --   --   ALKPHOS 49  --   --   BILITOT 1.0  --   --    < > = values in this interval not displayed.     Cardiac Enzymes No results for input(s): TROPONINI in the last 168 hours.  Microbiology Results  Results for orders placed or performed during the hospital encounter of 07/23/17  CULTURE, BLOOD (ROUTINE X 2) w Reflex to ID Panel     Status: None   Collection Time: 07/24/17 11:57 AM  Result Value Ref Range Status   Specimen Description BLOOD LT [facility]  Final   Special Requests   Final    BOTTLES DRAWN AEROBIC AND ANAEROBIC Blood Culture adequate volume   Culture   Final    NO GROWTH 5 DAYS Performed at [facility], [address] [place], [place] [address]    Report Status 07/29/2017 FINAL  Final  CULTURE, BLOOD (ROUTINE X 2) w Reflex to ID Panel     Status: None   Collection Time: 07/24/17 11:57 AM  Result Value Ref Range Status   Specimen Description BLOOD RT  FA  Final   Special Requests   Final    BOTTLES DRAWN AEROBIC AND ANAEROBIC Blood Culture results may not be optimal due to an excessive volume of blood received in culture bottles   Culture   Final    NO GROWTH 5 DAYS Performed at [facility], [address] [place], [place] [address]    Report Status 07/29/2017 FINAL  Final  Culture, expectorated sputum-assessment     Status: None   Collection Time: 07/26/17 12:40 PM  Result Value Ref Range Status   Specimen Description SPUTUM  Final   Special Requests Normal  Final   Sputum evaluation   Final    THIS SPECIMEN IS ACCEPTABLE FOR SPUTUM CULTURE Performed at [facility], [address] [place], [place] [address]    Report Status 07/26/2017 FINAL  Final  Culture, respiratory (NON-Expectorated)     Status: None   Collection Time: 07/26/17 12:40 PM  Result Value Ref Range Status   Specimen Description   Final    SPUTUM Performed at [facility], [address], [place], [place] [address]    Special Requests   Final    Normal Reflexed from [phone] Performed at [facility], [address] [place], [place]  [address]    Gram Stain   Final    FEW WBC PRESENT, PREDOMINANTLY PMN RARE SQUAMOUS EPITHELIAL CELLS PRESENT MODERATE GRAM POSITIVE COCCI IN CLUSTERS    Culture   Final    ABUNDANT Consistent with normal respiratory flora. Performed at [facility], [place] [address] [place], [place] [address]    Report Status 07/29/2017 FINAL  Final    RADIOLOGY:  US Liver Doppler  Result Date: 08/02/2017 CLINICAL DATA:  82 year old male with elevated liver enzymes EXAM: DUPLEX ULTRASOUND OF LIVER TECHNIQUE: Color and duplex Doppler ultrasound was performed to evaluate the hepatic in-flow and out-flow vessels. COMPARISON:  Concurrently obtained CT scan of the abdomen and pelvis 08/01/2017 FINDINGS: Portal Vein Velocities Main:  33-44 cm/sec with normal hepatopetal flow. Right:  11 cm/sec with normal hepatopetal flow. Left:  21 cm/sec with normal hepatopetal flow. Hepatic Vein Velocities Right:  35 cm/sec Middle:  20 cm/sec Left:  11 cm/sec Hepatic Artery Velocity:  59 cm/sec Splenic Vein Velocity:  26 cm/sec Varices: None visualized Ascites: None visualized Normal splenic size and volume. Mildly heterogeneous and echogenic appearance of the hepatic parenchyma. IMPRESSION: 1. Widely patent portal veins with normal hepatopetal directional flow. No evidence of portal hypertension or portal thrombosis. 2. Suspect mild hepatic steatosis. Signed, Criselda Peaches, MD [facility] Radiology Electronically Signed   By: Jacqulynn Cadet M.D.   On: 08/02/2017 07:57   Dg Hip Unilat With Pelvis 2-3 Views Left  Result Date: 08/01/2017 CLINICAL DATA:  Left groin pain EXAM: DG HIP (WITH OR WITHOUT PELVIS) 2-3V LEFT COMPARISON:  None. FINDINGS: Pelvic ring is intact. Degenerative changes of the hip joints are noted bilaterally. No acute fracture or dislocation is seen. No soft tissue changes are noted. IMPRESSION: Degenerative change without acute abnormality. Electronically  Signed   By: Inez Catalina M.D.   On: 08/01/2017 16:06   Dg Hip Unilat With Pelvis 2-3 Views Right  Result Date: 08/01/2017 CLINICAL DATA:  Right groin pain today.  Fell 1 week ago. EXAM: DG HIP (WITH OR WITHOUT PELVIS) 2-3V RIGHT COMPARISON:  Pelvis CT obtained earlier today. FINDINGS: Mild bilateral hip degenerative changes. No fracture or dislocation seen. Diffuse osteopenia. Excreted contrast  in the urinary bladder. IMPRESSION: 1. No fracture or dislocation. 2. Mild bilateral hip degenerative changes. Electronically Signed   By: Claudie Revering M.D.   On: 08/01/2017 16:07      Management plans discussed with the patient, family and they are in agreement.  CODE STATUS:  Code Status History    Date Active Date Inactive Code Status Order ID Comments User Context   08/01/2017 1626 08/02/2017 2126 Full Code [id number]  Salary, Avel Peace, MD Inpatient   TOTAL TIME TAKING CARE OF THIS PATIENT: 40 minutes.    Henreitta Leber M.D on 08/03/2017 at 2:34 PM  Between 7am to 6pm - Pager - [phone]  After 6pm go to [email or web address] - Technical brewer [facility]  Office  [phone]  CC: Primary care physician; Lin Landsman, MD

## 2019-03-04 IMAGING — CT CT ABD-PELV W/ CM
2 of 5 series · 16 of 46 positions shown, 18 images · IV contrast (APPLIED)
Comparison: Chest, abdomen pelvis CT dated 07/24/2017.

CLINICAL DATA: Bilateral lower abdominal pain, vomiting and
diarrhea.

EXAM:
CT ABDOMEN AND PELVIS WITH CONTRAST
TECHNIQUE: Multidetector CT imaging of the abdomen and pelvis was performed
using the standard protocol following bolus administration of
intravenous contrast.
CONTRAST:  75mL 8GM7BM-8TT IOPAMIDOL (8GM7BM-8TT) INJECTION 61%

[Series 2: routine abd/pel with · axial · 0.74mm/px · z∈[-481,-101]mm · 13 of 85 slices shown, 15 images]
[im 5/85  soft-tissue]
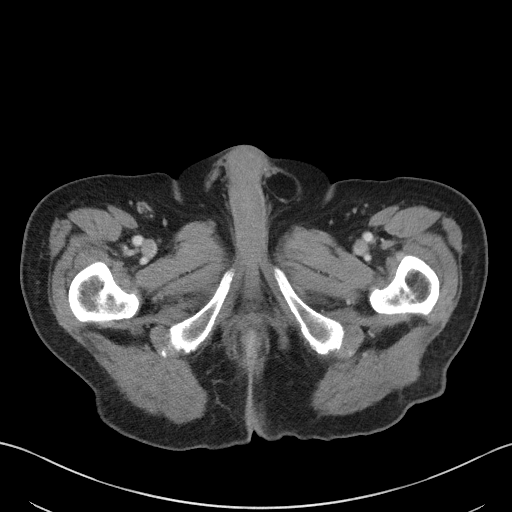
[im 5/85  bone]
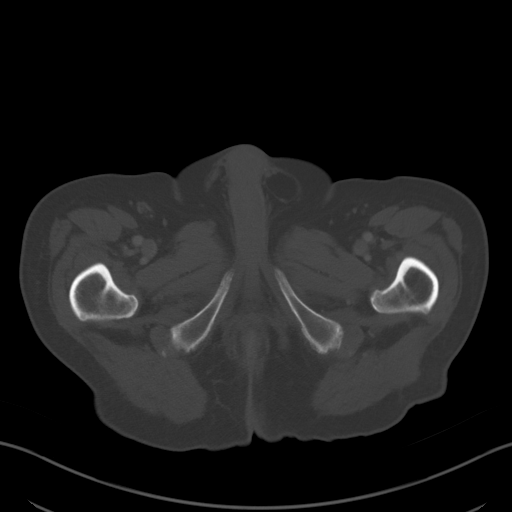
[im 13/85  soft-tissue]
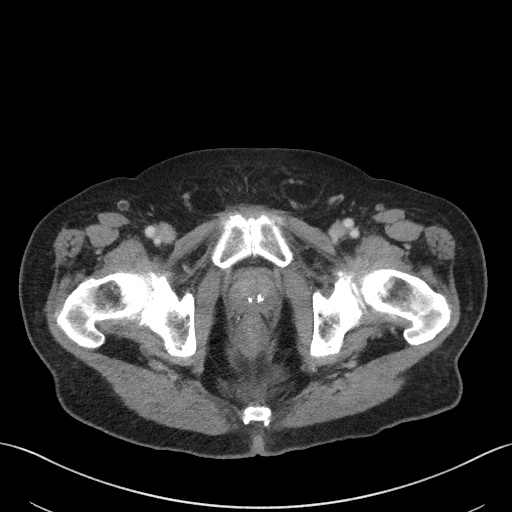
[im 17/85  soft-tissue]
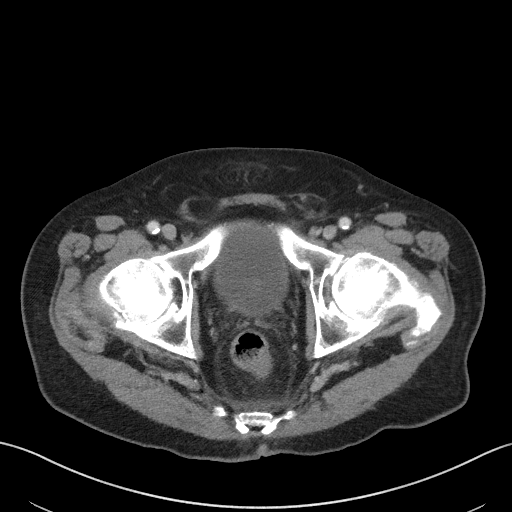
[im 25/85  soft-tissue]
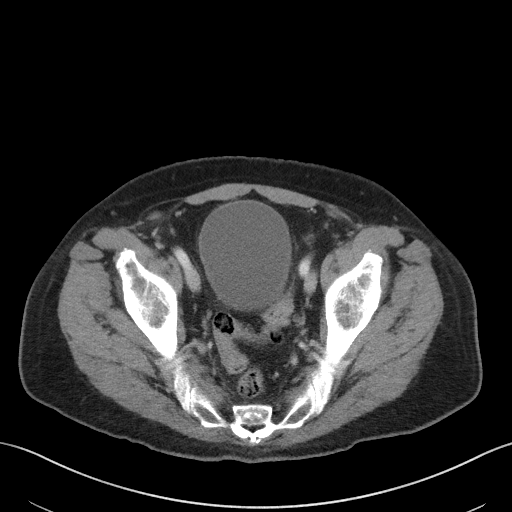
[im 29/85  soft-tissue]
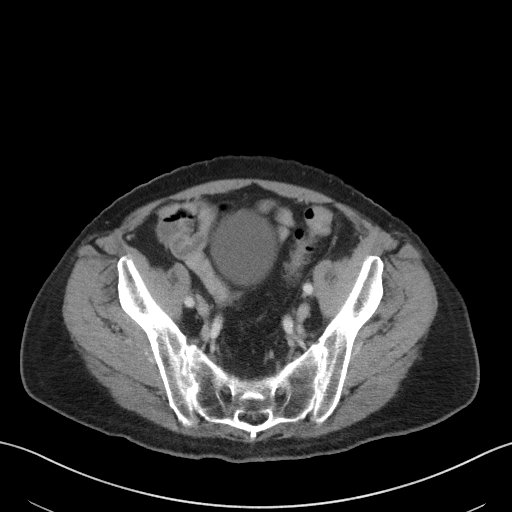
[im 37/85  soft-tissue]
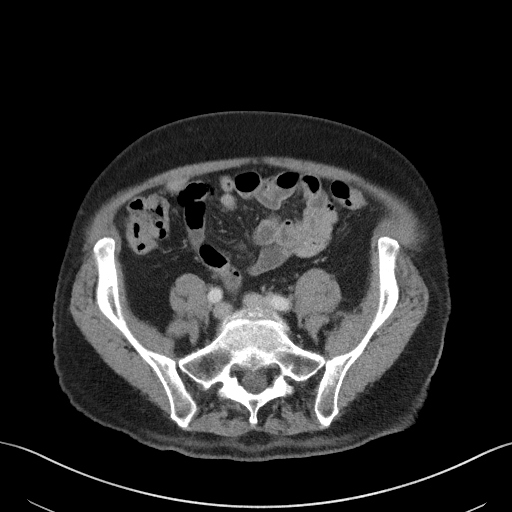
[im 45/85  soft-tissue]
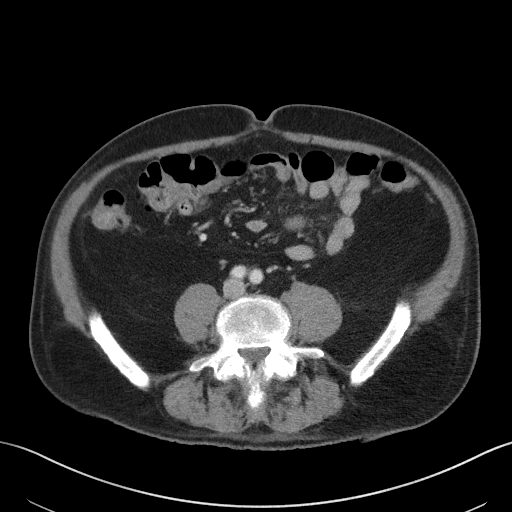
[im 49/85  soft-tissue]
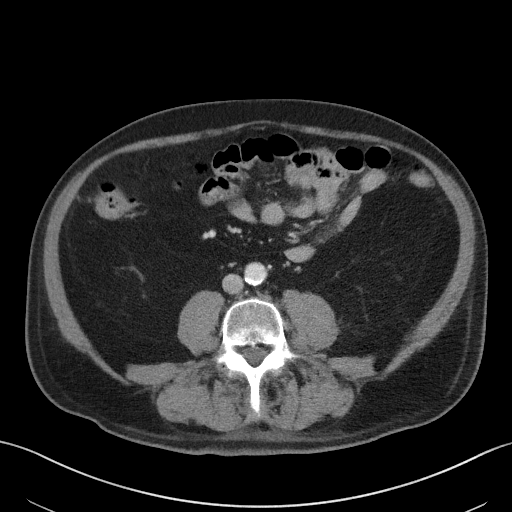
[im 57/85  soft-tissue]
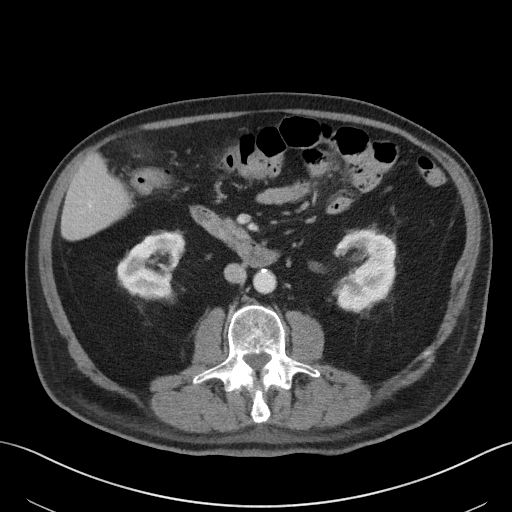
[im 57/85  bone]
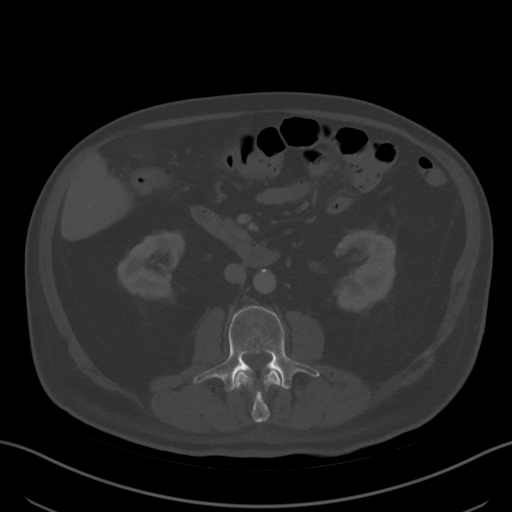
[im 61/85  soft-tissue]
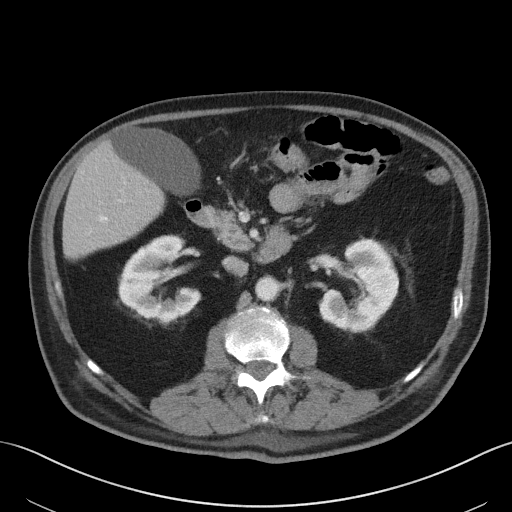
[im 69/85  soft-tissue]
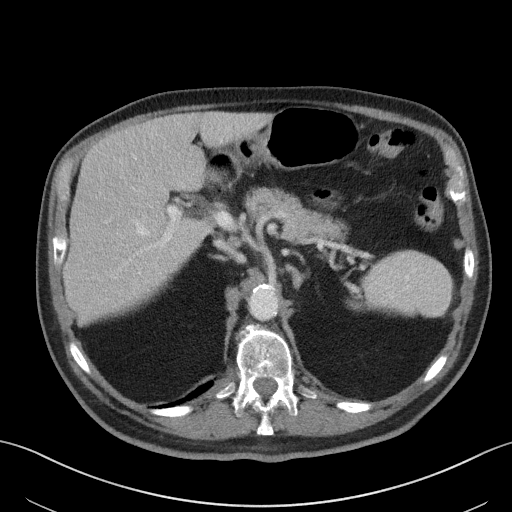
[im 73/85  soft-tissue]
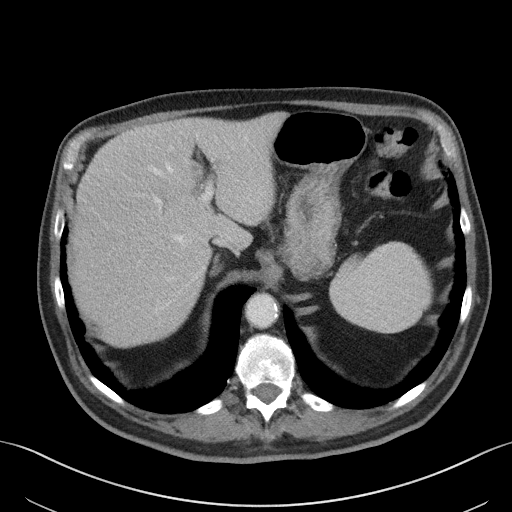
[im 81/85  soft-tissue]
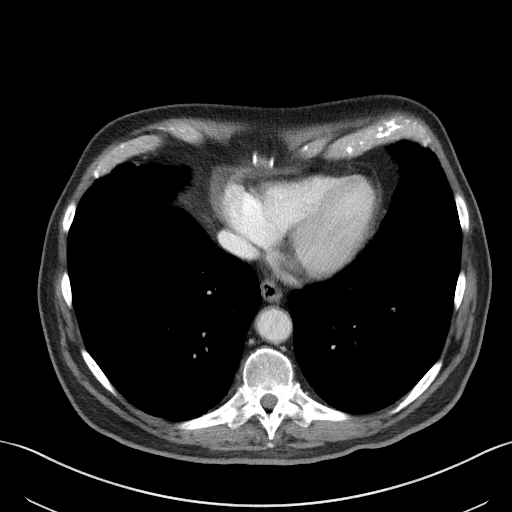

[Series 5: coronal st · coronal · 0.66mm/px · 3 of 95 slices shown]
[im 32/95  soft-tissue]
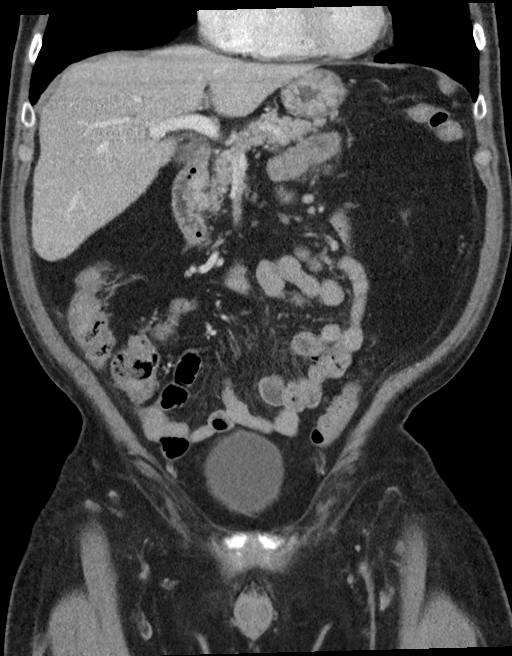
[im 42/95  soft-tissue]
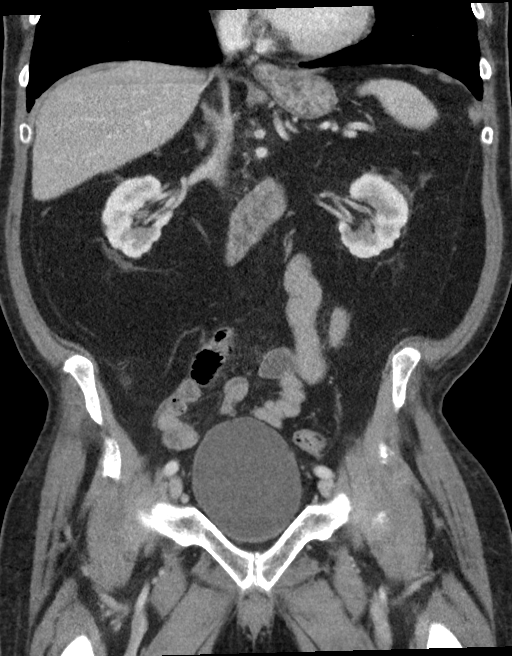
[im 53/95  soft-tissue]
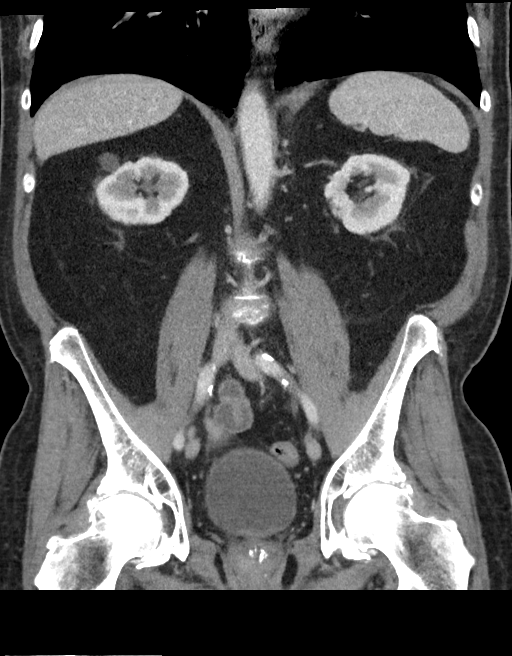

[16 of 46 positions shown; findings below may reference images not displayed]

FINDINGS: Lower chest: 5 mm left lower lobe nodule on image number 5 of series
4 with a slight decrease in size and density.

Hepatobiliary: Minimal diffuse low density of the liver relative to
the spleen. Normal appearing gallbladder.

Pancreas: Unremarkable. No pancreatic ductal dilatation or
surrounding inflammatory changes.

Spleen: Normal in size without focal abnormality.

Adrenals/Urinary Tract: Small exophytic upper pole right renal cyst.
Unremarkable left kidney, adrenal glands, ureters and urinary
bladder.

Stomach/Bowel: Surgically absent appendix. Unremarkable stomach,
small bowel and colon.

Vascular/Lymphatic: Atheromatous arterial calcifications without
aneurysm. No enlarged lymph nodes.

Reproductive: Normal sized prostate gland containing coarse
calcifications.

Other: Bilateral inguinal hernias containing fat.

Musculoskeletal: Bilateral hip degenerative changes. Mild lumbar and
lower thoracic spine degenerative changes.
IMPRESSION: 1. No acute abnormality.
2. Minimal diffuse hepatic steatosis.

## 2019-12-12 ENCOUNTER — Ambulatory Visit: Payer: Medicare HMO | Admitting: Dermatology

## 2019-12-12 ENCOUNTER — Other Ambulatory Visit: Payer: Self-pay

## 2019-12-12 DIAGNOSIS — D229 Melanocytic nevi, unspecified: Secondary | ICD-10-CM | POA: Diagnosis not present

## 2019-12-12 DIAGNOSIS — Z85828 Personal history of other malignant neoplasm of skin: Secondary | ICD-10-CM

## 2019-12-12 DIAGNOSIS — L814 Other melanin hyperpigmentation: Secondary | ICD-10-CM

## 2019-12-12 DIAGNOSIS — L570 Actinic keratosis: Secondary | ICD-10-CM

## 2019-12-12 DIAGNOSIS — D1800 Hemangioma unspecified site: Secondary | ICD-10-CM | POA: Diagnosis not present

## 2019-12-12 DIAGNOSIS — L821 Other seborrheic keratosis: Secondary | ICD-10-CM

## 2019-12-12 DIAGNOSIS — Z1283 Encounter for screening for malignant neoplasm of skin: Secondary | ICD-10-CM

## 2019-12-12 DIAGNOSIS — Z86007 Personal history of in-situ neoplasm of skin: Secondary | ICD-10-CM

## 2019-12-12 DIAGNOSIS — D492 Neoplasm of unspecified behavior of bone, soft tissue, and skin: Secondary | ICD-10-CM

## 2019-12-12 DIAGNOSIS — D044 Carcinoma in situ of skin of scalp and neck: Secondary | ICD-10-CM

## 2019-12-12 DIAGNOSIS — L578 Other skin changes due to chronic exposure to nonionizing radiation: Secondary | ICD-10-CM

## 2019-12-12 NOTE — Progress Notes (Signed)
Follow-Up Visit   Subjective  Richard Acosta is a 84 y.o. male who presents for the following: TBSE. Pateint presents today for annual TBSE, has area of concern on scalp that is scaly. 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  The following portions of the chart were reviewed this encounter and updated as appropriate:  Tobacco  Allergies  Meds  Problems  Med Hx  Surg Hx  Fam Hx     Review of Systems:  No other skin or systemic complaints except as noted in HPI or Assessment and Plan.  Objective  Well appearing patient in no apparent distress; mood and affect are within normal limits.  A full examination was performed including scalp, head, eyes, ears, nose, lips, neck, chest, axillae, abdomen, back, buttocks, bilateral upper extremities, bilateral lower extremities, hands, feet, fingers, toes, fingernails, and toenails. All findings within normal limits unless otherwise noted below.  Objective  Crown Scalp: 1.1 cm hypertrophic papule  Objective  Scalp x 10 (10): Erythematous thin papules/macules with gritty scale.    Assessment & Plan  Neoplasm of skin Crown Scalp  Epidermal / dermal shaving  Lesion diameter (cm):  1.1 Informed consent: discussed and consent obtained   Timeout: patient name, date of birth, surgical site, and procedure verified   Procedure prep:  Patient was prepped and draped in usual sterile fashion Prep type:  Isopropyl alcohol Anesthesia: the lesion was anesthetized in a standard fashion   Anesthetic:  1% lidocaine w/ epinephrine 1-100,000 buffered w/ 8.4% NaHCO3 Instrument used: flexible razor blade   Hemostasis achieved with: pressure, aluminum chloride and electrodesiccation   Outcome: patient tolerated procedure well   Post-procedure details: sterile  dressing applied and wound care instructions given   Dressing type: bandage and petrolatum    Destruction of lesion Complexity: simple   Destruction method: electrodesiccation and curettage   Informed consent: discussed and consent obtained   Timeout:  patient name, date of birth, surgical site, and procedure verified Procedure prep:  Patient was prepped and draped in usual sterile fashion Prep type:  Isopropyl alcohol Anesthesia: the lesion was anesthetized in a standard fashion   Anesthetic:  1% lidocaine w/ epinephrine 1-100,000 buffered w/ 8.4% NaHCO3 Lesion length (cm):  1.1 Lesion width (cm):  1.1 Margin per side (cm):  0.2 Final wound size (cm):  1.5 Hemostasis achieved with:  pressure, aluminum chloride and electrodesiccation Outcome: patient tolerated procedure well with no complications   Post-procedure details: sterile dressing applied and wound care instructions given   Dressing type: bandage and petrolatum    Specimen 1 - Surgical pathology Differential Diagnosis: AK vs SCC Check Margins: No 1.1 cm hypertrophic papule  Shave removal today with ED&C  AK (actinic keratosis) (10) Scalp x 10  Cryotherapy today Prior to procedure, discussed risks of blister formation, small wound, skin dyspigmentation, or rare scar following cryotherapy.    Destruction of lesion - Scalp x 10 Complexity: simple   Destruction method: cryotherapy   Informed consent: discussed and consent obtained   Timeout:  patient name, date of birth, surgical site, and procedure verified Lesion destroyed using liquid nitrogen: Yes   Region frozen until ice ball extended beyond lesion: Yes   Outcome: patient tolerated procedure well with no complications   Post-procedure details: wound care instructions given  Lentigines - Scattered tan macules - Discussed due to sun exposure - Benign, observe - Call for any changes  Seborrheic Keratoses - Stuck-on, waxy, tan-brown papules and plaques  -  Discussed benign etiology and prognosis. - Observe - Call for any changes  Melanocytic Nevi - Tan-brown and/or pink-flesh-colored symmetric macules and papules - Benign appearing on exam today - Observation - Call clinic for new or changing moles - Recommend daily use of broad spectrum spf 30+ sunscreen to sun-exposed areas.   Hemangiomas - Red papules - Discussed benign nature - Observe - Call for any changes  Actinic Damage - diffuse scaly erythematous macules with underlying dyspigmentation - Recommend daily broad spectrum sunscreen SPF 30+ to sun-exposed areas, reapply every 2 hours as needed.  - Call for new or changing lesions.  Skin cancer screening performed today.  History of Basal Cell Carcinoma of the Skin R mastoid post auricular 02/13/16 and L ant lat top of shoulder 03/23/14 - No evidence of recurrence today - Recommend regular full body skin exams - Recommend daily broad spectrum sunscreen SPF 30+ to sun-exposed areas, reapply every 2 hours as needed.  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  - Call if any new or changing lesions are noted between office visits   Return in about 5 months (around 05/13/2020) for Biopsy follow up, AK Follow up.  Marene Lenz, CMA, am acting as scribe for Sarina Ser, MD . Documentation: I have reviewed the above documentation for accuracy and completeness, and I agree with the above.  Sarina Ser, MD

## 2019-12-12 NOTE — Patient Instructions (Addendum)
Recommend daily broad spectrum sunscreen SPF 30+ to sun-exposed areas, reapply every 2 hours as needed. Call for new or changing lesions.  Prior to procedure, discussed risks of blister formation, small wound, skin dyspigmentation, or rare scar following cryotherapy.  Liquid nitrogen was applied for 10-12 seconds to the skin lesion and the expected blistering or scabbing reaction explained. Do not pick at the area. Patient reminded to expect hypopigmented scars from the procedure. Return if lesion fails to fully resolve.  Cryotherapy Aftercare  . Wash gently with soap and water everyday.   . Apply Vaseline and Band-Aid daily until healed.   Wound Care Instructions  1. Cleanse wound gently with soap and water once a day then pat dry with clean gauze. Apply a thing coat of Petrolatum (petroleum jelly, "Vaseline") over the wound (unless you have an allergy to this). We recommend that you use a new, sterile tube of Vaseline. Do not pick or remove scabs. Do not remove the yellow or white "healing tissue" from the base of the wound.  2. Cover the wound with fresh, clean, nonstick gauze and secure with paper tape. You may use Band-Aids in place of gauze and tape if the would is small enough, but would recommend trimming much of the tape off as there is often too much. Sometimes Band-Aids can irritate the skin.  3. You should call the office for your biopsy report after 1 week if you have not already been contacted.  4. If you experience any problems, such as abnormal amounts of bleeding, swelling, significant bruising, significant pain, or evidence of infection, please call the office immediately.  5. FOR ADULT SURGERY PATIENTS: If you need something for pain relief you may take 1 extra strength Tylenol (acetaminophen) AND 2 Ibuprofen (200mg each) together every 4 hours as needed for pain. (do not take these if you are allergic to them or if you have a reason you should not take them.) Typically, you  may only need pain medication for 1 to 3 days.     

## 2019-12-15 ENCOUNTER — Telehealth: Payer: Self-pay

## 2019-12-15 NOTE — Telephone Encounter (Signed)
Spoke with pt and informed him of results. He had no concerns.  

## 2019-12-15 NOTE — Telephone Encounter (Signed)
Left message on voicemail to return my call.  

## 2019-12-15 NOTE — Telephone Encounter (Signed)
-----   Message from Ralene Bathe, MD sent at 12/15/2019 12:36 PM EDT ----- Skin , crown scalp SQUAMOUS CELL CARCINOMA IN SITU  Cancer - SCC in situ Superficial Already treated Recheck next visit

## 2019-12-16 ENCOUNTER — Encounter: Payer: Self-pay | Admitting: Dermatology

## 2020-04-30 ENCOUNTER — Ambulatory Visit: Payer: Medicare HMO | Admitting: Dermatology

## 2020-05-25 ENCOUNTER — Other Ambulatory Visit (HOSPITAL_COMMUNITY): Payer: Self-pay | Admitting: Otolaryngology

## 2020-05-25 ENCOUNTER — Other Ambulatory Visit: Payer: Self-pay | Admitting: Otolaryngology

## 2020-05-25 DIAGNOSIS — R42 Dizziness and giddiness: Secondary | ICD-10-CM

## 2020-06-07 ENCOUNTER — Ambulatory Visit
Admission: RE | Admit: 2020-06-07 | Discharge: 2020-06-07 | Disposition: A | Discharge status: Home / Self Care | Payer: Medicare HMO | Source: Ambulatory Visit | Attending: Otolaryngology | Admitting: Otolaryngology

## 2020-06-07 ENCOUNTER — Other Ambulatory Visit: Payer: Self-pay

## 2020-06-07 DIAGNOSIS — R42 Dizziness and giddiness: Secondary | ICD-10-CM | POA: Diagnosis not present

## 2020-06-07 MED ORDER — GADOBUTROL 1 MMOL/ML IV SOLN
7.0000 mL | Freq: Once | INTRAVENOUS | Status: AC | PRN
  Administered 2020-06-07: 7 mL via INTRAVENOUS

## 2020-06-14 ENCOUNTER — Ambulatory Visit: Payer: Medicare HMO | Admitting: Dermatology

## 2020-06-14 ENCOUNTER — Other Ambulatory Visit: Payer: Self-pay

## 2020-06-14 DIAGNOSIS — L570 Actinic keratosis: Secondary | ICD-10-CM

## 2020-06-14 DIAGNOSIS — L578 Other skin changes due to chronic exposure to nonionizing radiation: Secondary | ICD-10-CM | POA: Diagnosis not present

## 2020-06-14 DIAGNOSIS — Z86007 Personal history of in-situ neoplasm of skin: Secondary | ICD-10-CM

## 2020-06-14 DIAGNOSIS — L821 Other seborrheic keratosis: Secondary | ICD-10-CM | POA: Diagnosis not present

## 2020-06-14 NOTE — Progress Notes (Signed)
   Follow-Up Visit   Subjective  Richard Acosta is a 85 y.o. male who presents for the following: Follow-up (Recheck crown of scalp SCCis treated and removed from crown of scalp 12-11-20) and Actinic Keratosis (Recheck face and scalp hx of Aks ).  The following portions of the chart were reviewed this encounter and updated as appropriate:   Tobacco  Allergies  Meds  Problems  Med Hx  Surg Hx  Fam Hx     Review of Systems:  No other skin or systemic complaints except as noted in HPI or Assessment and Plan.  Objective  Well appearing patient in no apparent distress; mood and affect are within normal limits.  A focused examination was performed including face,scalp . Relevant physical exam findings are noted in the Assessment and Plan.  Objective  face, scalp (16): Erythematous thin papules/macules with gritty scale.   Objective  crown of scalp: Well healed scar with no evidence of recurrence.    Assessment & Plan  AK (actinic keratosis) (16) face, scalp Hypertrophic Aks  Recheck in 2 months  Destruction of lesion - face, scalp Complexity: simple   Destruction method: cryotherapy   Informed consent: discussed and consent obtained   Timeout:  patient name, date of birth, surgical site, and procedure verified Lesion destroyed using liquid nitrogen: Yes   Region frozen until ice ball extended beyond lesion: Yes   Outcome: patient tolerated procedure well with no complications   Post-procedure details: wound care instructions given    History of squamous cell carcinoma in situ (SCCIS) of skin crown of scalp Clear. Observe for recurrence. Call clinic for new or changing lesions.  Recommend regular skin exams, daily broad-spectrum spf 30+ sunscreen use, and photoprotection.     Seborrheic Keratoses - Stuck-on, waxy, tan-brown papules and plaques  - Discussed benign etiology and prognosis. - Observe - Call for any changes  Actinic Damage - chronic, secondary to  cumulative UV radiation exposure/sun exposure over time - diffuse scaly erythematous macules with underlying dyspigmentation - Recommend daily broad spectrum sunscreen SPF 30+ to sun-exposed areas, reapply every 2 hours as needed.  - Recommend staying in the shade or wearing long sleeves, sun glasses (UVA+UVB protection) and wide brim hats (4-inch brim around the entire circumference of the hat). - Call for new or changing lesions.  Return in about 2 months (around 08/14/2020) for AKS, hx of skin cancers.  IMarye Round, CMA, am acting as scribe for Sarina Ser, MD .  Documentation: I have reviewed the above documentation for accuracy and completeness, and I agree with the above.  Sarina Ser, MD

## 2020-06-14 NOTE — Patient Instructions (Addendum)
Cryotherapy Aftercare  . Wash gently with soap and water everyday.   . Apply Vaseline and Band-Aid daily until healed.    Recommend daily broad spectrum sunscreen SPF 30+ to sun-exposed areas, reapply every 2 hours as needed. Call for new or changing lesions.  Staying in the shade or wearing long sleeves, sun glasses (UVA+UVB protection) and wide brim hats (4-inch brim around the entire circumference of the hat) are also recommended for sun protection.    

## 2020-06-14 NOTE — Progress Notes (Deleted)
   Follow-Up Visit   Subjective  Richard Acosta is a 85 y.o. male who presents for the following: Follow-up (Recheck crown of scalp SCCis treated and removed from crown of scalp 12-11-20) and Actinic Keratosis (Recheck face and scalp hx of Aks ).    The following portions of the chart were reviewed this encounter and updated as appropriate:       Review of Systems:  No other skin or systemic complaints except as noted in HPI or Assessment and Plan.  Objective  Well appearing patient in no apparent distress; mood and affect are within normal limits.  A focused examination was performed including face,scalp . Relevant physical exam findings are noted in the Assessment and Plan.  Objective  face, scalp (16): Erythematous thin papules/macules with gritty scale.   Objective  crown of scalp: Well healed scar with no evidence of recurrence.    Assessment & Plan  AK (actinic keratosis) (16) face, scalp  Hypertrophic Aks  Recheck in 2 months   Destruction of lesion - face, scalp Complexity: simple   Destruction method: cryotherapy   Informed consent: discussed and consent obtained   Timeout:  patient name, date of birth, surgical site, and procedure verified Lesion destroyed using liquid nitrogen: Yes   Region frozen until ice ball extended beyond lesion: Yes   Outcome: patient tolerated procedure well with no complications   Post-procedure details: wound care instructions given    History of squamous cell carcinoma in situ (SCCIS) of skin crown of scalp  Clear. Observe for recurrence. Call clinic for new or changing lesions.  Recommend regular skin exams, daily broad-spectrum spf 30+ sunscreen use, and photoprotection.     Actinic Damage - chronic, secondary to cumulative UV radiation exposure/sun exposure over time - diffuse scaly erythematous macules with underlying dyspigmentation - Recommend daily broad spectrum sunscreen SPF 30+ to sun-exposed areas, reapply every 2  hours as needed.  - Recommend staying in the shade or wearing long sleeves, sun glasses (UVA+UVB protection) and wide brim hats (4-inch brim around the entire circumference of the hat).  Seborrheic Keratoses - Stuck-on, waxy, tan-brown papules and plaques  - Discussed benign etiology and prognosis. - Observe - Call for any changes    I, Marye Round, CMA, am acting as scribe for Sarina Ser, MD .

## 2020-06-18 ENCOUNTER — Encounter: Payer: Self-pay | Admitting: Dermatology

## 2020-07-08 NOTE — Addendum Note (Signed)
Encounter addended by: Annie Paras on: 07/08/2020 11:12 AM  Actions taken: Letter saved

## 2020-09-06 ENCOUNTER — Encounter: Payer: Self-pay | Admitting: Dermatology

## 2020-09-06 ENCOUNTER — Ambulatory Visit: Payer: Medicare HMO | Admitting: Dermatology

## 2020-09-06 ENCOUNTER — Other Ambulatory Visit: Payer: Self-pay

## 2020-09-06 DIAGNOSIS — L570 Actinic keratosis: Secondary | ICD-10-CM | POA: Diagnosis not present

## 2020-09-06 DIAGNOSIS — L578 Other skin changes due to chronic exposure to nonionizing radiation: Secondary | ICD-10-CM

## 2020-09-06 DIAGNOSIS — L304 Erythema intertrigo: Secondary | ICD-10-CM

## 2020-09-06 DIAGNOSIS — L821 Other seborrheic keratosis: Secondary | ICD-10-CM

## 2020-09-06 DIAGNOSIS — L820 Inflamed seborrheic keratosis: Secondary | ICD-10-CM

## 2020-09-06 MED ORDER — KETOCONAZOLE 2 % EX CREA: 1.0000 "application " | TOPICAL_CREAM | Freq: Every day | CUTANEOUS | 4 refills | Status: AC

## 2020-09-06 NOTE — Patient Instructions (Signed)

## 2020-09-06 NOTE — Progress Notes (Signed)
   Follow-Up Visit   Subjective  Richard Acosta is a 85 y.o. male who presents for the following: Actinic Keratosis (Face, scalp, 47m f/u) and Rash (groin).  The following portions of the chart were reviewed this encounter and updated as appropriate:   Tobacco  Allergies  Meds  Problems  Med Hx  Surg Hx  Fam Hx     Review of Systems:  No other skin or systemic complaints except as noted in HPI or Assessment and Plan.  Objective  Well appearing patient in no apparent distress; mood and affect are within normal limits.  A focused examination was performed including face, scalp, ears, groin. Relevant physical exam findings are noted in the Assessment and Plan.  Objective  Scalp x 6, L ear x 1 (7): Pink scaly macules   Objective  Left preauricular x 1: Erythematous keratotic or waxy stuck-on papule or plaque.   Objective  groin: Erythema and maceration groin   Assessment & Plan    Actinic Damage - chronic, secondary to cumulative UV radiation exposure/sun exposure over time - diffuse scaly erythematous macules with underlying dyspigmentation - Recommend daily broad spectrum sunscreen SPF 30+ to sun-exposed areas, reapply every 2 hours as needed.  - Recommend staying in the shade or wearing long sleeves, sun glasses (UVA+UVB protection) and wide brim hats (4-inch brim around the entire circumference of the hat). - Call for new or changing lesions.  Seborrheic Keratoses - Stuck-on, waxy, tan-brown papules and/or plaques  - Benign-appearing - Discussed benign etiology and prognosis. - Observe - Call for any changes  AK (actinic keratosis) (7) Scalp x 6, L ear x 1 Destruction of lesion - Scalp x 6, L ear x 1 Complexity: simple   Destruction method: cryotherapy   Informed consent: discussed and consent obtained   Timeout:  patient name, date of birth, surgical site, and procedure verified Lesion destroyed using liquid nitrogen: Yes   Region frozen until ice ball  extended beyond lesion: Yes   Outcome: patient tolerated procedure well with no complications   Post-procedure details: wound care instructions given    Inflamed seborrheic keratosis Left preauricular x 1 Destruction of lesion - Left preauricular x 1 Complexity: simple   Destruction method: cryotherapy   Informed consent: discussed and consent obtained   Timeout:  patient name, date of birth, surgical site, and procedure verified Lesion destroyed using liquid nitrogen: Yes   Region frozen until ice ball extended beyond lesion: Yes   Outcome: patient tolerated procedure well with no complications   Post-procedure details: wound care instructions given    Intertrigo groin Intertrigo is a chronic recurrent rash that occurs in skin fold areas that may be associated with friction; heat; moisture; yeast; fungus; and bacteria.  It is exacerbated by increased movement / activity; sweating; and higher atmospheric temperature.  Start Ketoconazole 2% cr qhs for 6 weeks ketoconazole (NIZORAL) 2 % cream - groin  Return in about 9 months (around 06/09/2021) for UBSE, Hx of BCC, Hx of SCC, Hx of AKs.  I, Othelia Pulling, RMA, am acting as scribe for Sarina Ser, MD .  Documentation: I have reviewed the above documentation for accuracy and completeness, and I agree with the above.  Sarina Ser, MD

## 2020-09-10 ENCOUNTER — Encounter: Payer: Self-pay | Admitting: Dermatology

## 2020-12-27 ENCOUNTER — Other Ambulatory Visit: Payer: Self-pay | Admitting: Neurosurgery

## 2020-12-27 ENCOUNTER — Other Ambulatory Visit (HOSPITAL_COMMUNITY): Payer: Self-pay | Admitting: Neurosurgery

## 2020-12-27 DIAGNOSIS — D352 Benign neoplasm of pituitary gland: Secondary | ICD-10-CM

## 2020-12-27 DIAGNOSIS — E236 Other disorders of pituitary gland: Secondary | ICD-10-CM

## 2021-01-24 ENCOUNTER — Other Ambulatory Visit: Payer: Self-pay

## 2021-01-24 ENCOUNTER — Ambulatory Visit
Admission: RE | Admit: 2021-01-24 | Discharge: 2021-01-24 | Disposition: A | Discharge status: Home / Self Care | Payer: Medicare HMO | Source: Ambulatory Visit | Attending: Neurosurgery | Admitting: Neurosurgery

## 2021-01-24 DIAGNOSIS — D352 Benign neoplasm of pituitary gland: Secondary | ICD-10-CM | POA: Diagnosis present

## 2021-01-24 DIAGNOSIS — E236 Other disorders of pituitary gland: Secondary | ICD-10-CM | POA: Diagnosis present

## 2021-01-24 MED ORDER — GADOBUTROL 1 MMOL/ML IV SOLN
5.0000 mL | Freq: Once | INTRAVENOUS | Status: AC | PRN
  Administered 2021-01-24: 5 mL via INTRAVENOUS

## 2021-05-27 ENCOUNTER — Encounter: Payer: Self-pay | Admitting: Dermatology

## 2021-05-27 ENCOUNTER — Ambulatory Visit: Payer: Medicare HMO | Admitting: Dermatology

## 2021-05-27 ENCOUNTER — Other Ambulatory Visit: Payer: Self-pay

## 2021-05-27 DIAGNOSIS — L570 Actinic keratosis: Secondary | ICD-10-CM | POA: Diagnosis not present

## 2021-05-27 DIAGNOSIS — L821 Other seborrheic keratosis: Secondary | ICD-10-CM

## 2021-05-27 DIAGNOSIS — D1800 Hemangioma unspecified site: Secondary | ICD-10-CM

## 2021-05-27 DIAGNOSIS — Z1283 Encounter for screening for malignant neoplasm of skin: Secondary | ICD-10-CM | POA: Diagnosis not present

## 2021-05-27 DIAGNOSIS — L578 Other skin changes due to chronic exposure to nonionizing radiation: Secondary | ICD-10-CM | POA: Diagnosis not present

## 2021-05-27 DIAGNOSIS — D229 Melanocytic nevi, unspecified: Secondary | ICD-10-CM

## 2021-05-27 DIAGNOSIS — Z85828 Personal history of other malignant neoplasm of skin: Secondary | ICD-10-CM | POA: Diagnosis not present

## 2021-05-27 DIAGNOSIS — L814 Other melanin hyperpigmentation: Secondary | ICD-10-CM

## 2021-05-27 MED ORDER — FLUOROURACIL 5 % EX CREA
TOPICAL_CREAM | CUTANEOUS | 1 refills | Status: DC
Start: 2021-05-27 — End: 2021-11-25

## 2021-05-27 NOTE — Patient Instructions (Addendum)
Cryotherapy Aftercare  Wash gently with soap and water everyday.   Apply Vaseline and Band-Aid daily until healed.   Prior to procedure, discussed risks of blister formation, small wound, skin dyspigmentation, or rare scar following cryotherapy. Recommend Vaseline ointment to treated areas while healing.   Start 5-fluorouracil/calcipotriene cream twice a day for 7 days to affected areas including scalp. Prescription sent to [facility]. Patient provided with contact information for pharmacy and advised the pharmacy will mail the prescription to their home. Patient provided with handout reviewing treatment course and side effects and advised to call or message [place] on MyChart with any concerns.  Start in mid March  Recommend daily broad spectrum sunscreen SPF 30+ to sun-exposed areas, reapply every 2 hours as needed. Call for new or changing lesions.  Staying in the shade or wearing long sleeves, sun glasses (UVA+UVB protection) and wide brim hats (4-inch brim around the entire circumference of the hat) are also recommended for sun protection.   If You Need Anything After Your Visit  If you have any questions or concerns for your doctor, please call our main line at [phone] and press option 4 to reach your doctor's medical assistant. If no one answers, please leave a voicemail as directed and we will return your call as soon as possible. Messages left after 4 pm will be answered the following business day.   You may also send [place] a message via [place]. We typically respond to MyChart messages within 1-2 business days.  For prescription refills, please ask your pharmacy to contact our office. Our fax number is [phone].  If you have an urgent issue when the clinic is closed that cannot wait until the next business day, you can page your doctor at the number below.    Please note that while we do our best to be available for urgent issues outside of office hours, we are not available  24/7.   If you have an urgent issue and are unable to reach [place], you may choose to seek medical care at your doctor's office, retail clinic, urgent care center, or emergency room.  If you have a medical emergency, please immediately call 911 or go to the emergency department.  Pager Numbers  - Dr. Nehemiah Massed: [phone]  - Dr. Laurence Ferrari: [phone]  - Dr. Nicole Kindred: [phone]  In the event of inclement weather, please call our main line at [phone] for an update on the status of any delays or closures.  Dermatology Medication Tips: Please keep the boxes that topical medications come in in order to help keep track of the instructions about where and how to use these. Pharmacies typically print the medication instructions only on the boxes and not directly on the medication tubes.   If your medication is too expensive, please contact our office at [phone] option 4 or send [place] a message through [place].   We are unable to tell what your co-pay for medications will be in advance as this is different depending on your insurance coverage. However, we may be able to find a substitute medication at lower cost or fill out paperwork to get insurance to cover a needed medication.   If a prior authorization is required to get your medication covered by your insurance company, please allow [place] 1-2 business days to complete this process.  Drug prices often vary depending on where the prescription is filled and some pharmacies may offer cheaper prices.  The website www.goodrx.com contains coupons for medications through different pharmacies. The  prices here do not account for what the cost may be with help from insurance (it may be cheaper with your insurance), but the website can give you the price if you did not use any insurance.  - You can print the associated coupon and take it with your prescription to the pharmacy.  - You may also stop by our office during regular business hours and pick up  a GoodRx coupon card.  - If you need your prescription sent electronically to a different pharmacy, notify our office through [facility] or by phone at [phone] option 4.     Si Usted Necesita Algo Despus de Su Visita  Tambin puede enviarnos un mensaje a travs de Pharmacist, community. Por lo general respondemos a los mensajes de MyChart en el transcurso de 1 a 2 das hbiles.  Para renovar recetas, por favor pida a su farmacia que se ponga en contacto con nuestra oficina. Harland Dingwall de fax es [place] [phone].  Si tiene un asunto urgente cuando la clnica est cerrada y que no puede esperar hasta el siguiente da hbil, puede llamar/localizar a su doctor(a) al nmero que aparece a continuacin.   Por favor, tenga en cuenta que aunque hacemos todo lo posible para estar disponibles para asuntos urgentes fuera del horario de [place], no estamos disponibles las 24 horas del da, los 7 das de la [place].   Si tiene un problema urgente y no puede comunicarse con nosotros, puede optar por buscar atencin mdica  en el consultorio de su doctor(a), en una clnica privada, en un centro de atencin urgente o en una sala de emergencias.  Si tiene Engineering geologist, por favor llame inmediatamente al 911 o vaya a la sala de emergencias.  Nmeros de bper  - Dr. Nehemiah Massed: [phone]  - Dra. Moye: [phone]  - Dra. Nicole Kindred: [phone]  En caso de inclemencias del [place], por favor llame a Johnsie Kindred principal al [phone] para una actualizacin sobre el [place] de cualquier retraso o cierre.  Consejos para la medicacin en dermatologa: Por favor, guarde las cajas en las que vienen los medicamentos de uso tpico para ayudarle a seguir las instrucciones sobre dnde y cmo usarlos. Las farmacias generalmente imprimen las instrucciones del medicamento slo en las cajas y no directamente en los tubos del [place].   Si su medicamento es muy caro, por favor, pngase en contacto  con Zigmund Daniel llamando al [phone] y presione la opcin 4 o envenos un mensaje a travs de Pharmacist, community.   No podemos decirle cul ser su copago por los medicamentos por adelantado ya que esto es diferente dependiendo de la cobertura de su seguro. Sin embargo, es posible que podamos encontrar un medicamento sustituto a Electrical engineer un formulario para que el seguro cubra el medicamento que se considera necesario.   Si se requiere una autorizacin previa para que su compaa de seguros [place] su medicamento, por favor permtanos de 1 a 2 das hbiles para completar este proceso.  Los precios de los medicamentos varan con frecuencia dependiendo del Environmental consultant de dnde se surte la receta y alguna farmacias pueden ofrecer precios ms baratos.  El sitio web www.goodrx.com tiene cupones para medicamentos de Airline pilot. Los precios aqu no tienen en cuenta lo que podra costar con la ayuda del seguro (puede ser ms barato con su seguro), pero el sitio web puede darle el precio si no utiliz Research scientist (physical sciences).  - Puede imprimir el cupn correspondiente y llevarlo con su receta a  la farmacia.  - Tambin puede pasar por nuestra oficina durante el horario de atencin regular y Charity fundraiser una tarjeta de cupones de GoodRx.  - Si necesita que su receta se enve electrnicamente a una farmacia diferente, informe a nuestra oficina a travs de MyChart de [place] o por telfono llamando al [phone] y presione la opcin 4.

## 2021-05-27 NOTE — Progress Notes (Signed)
Follow-Up Visit   Subjective  Richard Acosta is a 86 y.o. male who presents for the following: Annual Exam (Here for skin cancer screening. Upper body. Hx Ak's, Hx SCC's, BCC's. Area of concern on back). The patient presents for Upper Body Skin Exam (UBSE) for skin cancer screening and mole check.  The patient has spots, moles and lesions to be evaluated, some may be new or changing and the patient has concerns that these could be cancer.   The following portions of the chart were reviewed this encounter and updated as appropriate:  Tobacco   Allergies   Meds   Problems   Med Hx   Surg Hx   Fam Hx      Review of Systems: No other skin or systemic complaints except as noted in HPI or Assessment and Plan.  Objective  Well appearing patient in no apparent distress; mood and affect are within normal limits.  All skin waist up examined.  Mid Back Stuck-on, waxy, tan-brown papule or plaque --Discussed benign etiology and prognosis.   Scalp x17, left ear x2 (19) Erythematous thin papules/macules with gritty scale.    Assessment & Plan   Lentigines - Scattered tan macules - Due to sun exposure - Benign-appearing, observe - Recommend daily broad spectrum sunscreen SPF 30+ to sun-exposed areas, reapply every 2 hours as needed. - Call for any changes  Seborrheic Keratoses - Stuck-on, waxy, tan-brown papules and/or plaques  - Benign-appearing - Discussed benign etiology and prognosis. - Observe - Call for any changes  Melanocytic Nevi - Tan-brown and/or pink-flesh-colored symmetric macules and papules - Benign appearing on exam today - Observation - Call clinic for new or changing moles - Recommend daily use of broad spectrum spf 30+ sunscreen to sun-exposed areas.   Hemangiomas - Red papules - Discussed benign nature - Observe - Call for any changes  Actinic Damage - Severe, confluent actinic changes with pre-cancerous actinic keratoses  - Severe, chronic, not at goal,  secondary to cumulative UV radiation exposure over time - diffuse scaly erythematous macules and papules with underlying dyspigmentation - Discussed Prescription "Field Treatment" for Severe, Chronic Confluent Actinic Changes with Pre-Cancerous Actinic Keratoses Field treatment involves treatment of an entire area of skin that has confluent Actinic Changes (Sun/ Ultraviolet light damage) and PreCancerous Actinic Keratoses by method of PhotoDynamic Therapy (PDT) and/or prescription Topical Chemotherapy agents such as 5-fluorouracil, 5-fluorouracil/calcipotriene, and/or imiquimod.  The purpose is to decrease the number of clinically evident and subclinical PreCancerous lesions to prevent progression to development of skin cancer by chemically destroying early precancer changes that may or may not be visible.  It has been shown to reduce the risk of developing skin cancer in the treated area. As a result of treatment, redness, scaling, crusting, and open sores may occur during treatment course. One or more than one of these methods may be used and may have to be used several times to control, suppress and eliminate the PreCancerous changes. Discussed treatment course, expected reaction, and possible side effects. - Recommend daily broad spectrum sunscreen SPF 30+ to sun-exposed areas, reapply every 2 hours as needed.  - Staying in the shade or wearing long sleeves, sun glasses (UVA+UVB protection) and wide brim hats (4-inch brim around the entire circumference of the hat) are also recommended. - Call for new or changing lesions.   Start 5-fluorouracil/calcipotriene cream twice a day for 7 days to affected areas including scalp. Prescription sent to [facility]. Patient provided with contact information for  pharmacy and advised the pharmacy will mail the prescription to their home. Patient provided with handout reviewing treatment course and side effects and advised to call or message [place] on MyChart with  any concerns.  Start in mid March  History of Squamous Cell Carcinoma of the Skin - No evidence of recurrence today at left lateral elbow and crown scalp - No lymphadenopathy - Recommend regular full body skin exams - Recommend daily broad spectrum sunscreen SPF 30+ to sun-exposed areas, reapply every 2 hours as needed.  - Call if any new or changing lesions are noted between office visits   History of Basal Cell Carcinoma of the Skin - No evidence of recurrence today at left ant lat top of shoulder and right mastoid postauricular - Recommend regular full body skin exams - Recommend daily broad spectrum sunscreen SPF 30+ to sun-exposed areas, reapply every 2 hours as needed.  - Call if any new or changing lesions are noted between office visits   Skin cancer screening performed today.  Seborrheic keratosis Mid Back Reassured benign age-related growth.  Recommend observation.  Discussed cryotherapy if spot(s) become irritated or inflamed.  AK (actinic keratosis) (19) Scalp x17, left ear x2 Actinic keratoses are precancerous spots that appear secondary to cumulative UV radiation exposure/sun exposure over time. They are chronic with expected duration over 1 year. A portion of actinic keratoses will progress to squamous cell carcinoma of the skin. It is not possible to reliably predict which spots will progress to skin cancer and so treatment is recommended to prevent development of skin cancer. Recommend daily broad spectrum sunscreen SPF 30+ to sun-exposed areas, reapply every 2 hours as needed.  Recommend staying in the shade or wearing long sleeves, sun glasses (UVA+UVB protection) and wide brim hats (4-inch brim around the entire circumference of the hat). Call for new or changing lesions.  Destruction of lesion - Scalp x17, left ear x2 Complexity: simple   Destruction method: cryotherapy   Informed consent: discussed and consent obtained   Timeout:  patient name, date of birth,  surgical site, and procedure verified Lesion destroyed using liquid nitrogen: Yes   Region frozen until ice ball extended beyond lesion: Yes   Outcome: patient tolerated procedure well with no complications   Post-procedure details: wound care instructions given    fluorouracil (EFUDEX) 5 % cream - Scalp x17, left ear x2 Apply twice daily to scalp for 7 days. Start in mid March  Skin cancer screening  Return in about 6 months (around 11/24/2021) for AK Follow Up.  I, Emelia Salisbury, CMA, am acting as scribe for Sarina Ser, MD. Documentation: I have reviewed the above documentation for accuracy and completeness, and I agree with the above.  Sarina Ser, MD

## 2021-05-31 ENCOUNTER — Encounter: Payer: Self-pay | Admitting: Dermatology

## 2021-11-25 ENCOUNTER — Ambulatory Visit: Payer: Medicare HMO | Admitting: Dermatology

## 2021-11-25 DIAGNOSIS — L578 Other skin changes due to chronic exposure to nonionizing radiation: Secondary | ICD-10-CM | POA: Diagnosis not present

## 2021-11-25 DIAGNOSIS — L570 Actinic keratosis: Secondary | ICD-10-CM

## 2021-11-25 DIAGNOSIS — L821 Other seborrheic keratosis: Secondary | ICD-10-CM

## 2021-11-25 DIAGNOSIS — L820 Inflamed seborrheic keratosis: Secondary | ICD-10-CM

## 2021-11-25 DIAGNOSIS — Z79899 Other long term (current) drug therapy: Secondary | ICD-10-CM | POA: Diagnosis not present

## 2021-11-25 MED ORDER — FLUOROURACIL 5 % EX CREA: TOPICAL_CREAM | CUTANEOUS | 1 refills | Status: DC

## 2021-11-25 NOTE — Patient Instructions (Addendum)
5-Fluorouracil/Calcipotriene Patient Education   Actinic keratoses are the dry, red scaly spots on the skin caused by sun damage. A portion of these spots can turn into skin cancer with time, and treating them can help prevent development of skin cancer.   Treatment of these spots requires removal of the defective skin cells. There are various ways to remove actinic keratoses, including freezing with liquid nitrogen, treatment with creams, or treatment with a blue light procedure in the office.   5-fluorouracil cream is a topical cream used to treat actinic keratoses. It works by interfering with the growth of abnormal fast-growing skin cells, such as actinic keratoses. These cells peel off and are replaced by healthy ones.   5-fluorouracil/calcipotriene is a combination of the 5-fluorouracil cream with a vitamin D analog cream called calcipotriene. The calcipotriene alone does not treat actinic keratoses. However, when it is combined with 5-fluorouracil, it helps the 5-fluorouracil treat the actinic keratoses much faster so that the same results can be achieved with a much shorter treatment time.  INSTRUCTIONS FOR 5-FLUOROURACIL/CALCIPOTRIENE CREAM:   5-fluorouracil/calcipotriene cream typically only needs to be used for 4-7 days. A thin layer should be applied twice a day to the treatment areas recommended by your physician.   If your physician prescribed you separate tubes of 5-fluourouracil and calcipotriene, apply a thin layer of 5-fluorouracil followed by a thin layer of calcipotriene.   Avoid contact with your eyes, nostrils, and mouth. Do not use 5-fluorouracil/calcipotriene cream on infected or open wounds.   You will develop redness, irritation and some crusting at areas where you have pre-cancer damage/actinic keratoses. IF YOU DEVELOP PAIN, BLEEDING, OR SIGNIFICANT CRUSTING, STOP THE TREATMENT EARLY - you have already gotten a good response and the actinic keratoses should clear up  well.  Wash your hands after applying 5-fluorouracil 5% cream on your skin.   A moisturizer or sunscreen with a minimum SPF 30 should be applied each morning.   Once you have finished the treatment, you can apply a thin layer of Vaseline twice a day to irritated areas to soothe and calm the areas more quickly. If you experience significant discomfort, contact your physician.  For some patients it is necessary to repeat the treatment for best results.  SIDE EFFECTS: When using 5-fluorouracil/calcipotriene cream, you may have mild irritation, such as redness, dryness, swelling, or a mild burning sensation. This usually resolves within 2 weeks. The more actinic keratoses you have, the more redness and inflammation you can expect during treatment. Eye irritation has been reported rarely. If this occurs, please let us know.  If you have any trouble using this cream, please call the office. If you have any other questions about this information, please do not hesitate to ask me before you leave the office.  Due to recent changes in healthcare laws, you may see results of your pathology and/or laboratory studies on MyChart before the doctors have had a chance to review them. We understand that in some cases there may be results that are confusing or concerning to you. Please understand that not all results are received at the same time and often the doctors may need to interpret multiple results in order to provide you with the best plan of care or course of treatment. Therefore, we ask that you please give us 2 business days to thoroughly review all your results before contacting the office for clarification. Should we see a critical lab result, you will be contacted sooner.   If You Need   Anything After Your Visit  If you have any questions or concerns for your doctor, please call our main line at [phone] and press option 4 to reach your doctor's medical assistant. If no one answers, please leave a  voicemail as directed and we will return your call as soon as possible. Messages left after 4 pm will be answered the following business day.   You may also send us a message via MyChart. We typically respond to MyChart messages within 1-2 business days.  For prescription refills, please ask your pharmacy to contact our office. Our fax number is [phone].  If you have an urgent issue when the clinic is closed that cannot wait until the next business day, you can page your doctor at the number below.    Please note that while we do our best to be available for urgent issues outside of office hours, we are not available 24/7.   If you have an urgent issue and are unable to reach us, you may choose to seek medical care at your doctor's office, retail clinic, urgent care center, or emergency room.  If you have a medical emergency, please immediately call 911 or go to the emergency department.  Pager Numbers  - Dr. Kowalski: [phone]  - Dr. Moye: [phone]  - Dr. Stewart: [phone]  In the event of inclement weather, please call our main line at [phone] for an update on the status of any delays or closures.  Dermatology Medication Tips: Please keep the boxes that topical medications come in in order to help keep track of the instructions about where and how to use these. Pharmacies typically print the medication instructions only on the boxes and not directly on the medication tubes.   If your medication is too expensive, please contact our office at [phone] option 4 or send us a message through MyChart.   We are unable to tell what your co-pay for medications will be in advance as this is different depending on your insurance coverage. However, we may be able to find a substitute medication at lower cost or fill out paperwork to get insurance to cover a needed medication.   If a prior authorization is required to get your medication covered by your insurance  company, please allow us 1-2 business days to complete this process.  Drug prices often vary depending on where the prescription is filled and some pharmacies may offer cheaper prices.  The website www.goodrx.com contains coupons for medications through different pharmacies. The prices here do not account for what the cost may be with help from insurance (it may be cheaper with your insurance), but the website can give you the price if you did not use any insurance.  - You can print the associated coupon and take it with your prescription to the pharmacy.  - You may also stop by our office during regular business hours and pick up a GoodRx coupon card.  - If you need your prescription sent electronically to a different pharmacy, notify our office through [place] MyChart or by phone at [phone] option 4.     Si Usted Necesita Algo Despus de Su Visita  Tambin puede enviarnos un mensaje a travs de MyChart. Por lo general respondemos a los mensajes de MyChart en el transcurso de 1 a 2 das hbiles.  Para renovar recetas, por favor pida a su farmacia que se ponga en contacto con nuestra oficina. Nuestro nmero de fax es el [phone].  Si tiene un asunto   urgente cuando la clnica est cerrada y que no puede esperar hasta el siguiente da hbil, puede llamar/localizar a su doctor(a) al nmero que aparece a continuacin.   Por favor, tenga en cuenta que aunque hacemos todo lo posible para estar disponibles para asuntos urgentes fuera del horario de oficina, no estamos disponibles las 24 horas del da, los 7 das de la semana.   Si tiene un problema urgente y no puede comunicarse con nosotros, puede optar por buscar atencin mdica  en el consultorio de su doctor(a), en una clnica privada, en un centro de atencin urgente o en una sala de emergencias.  Si tiene una emergencia mdica, por favor llame inmediatamente al 911 o vaya a la sala de emergencias.  Nmeros de bper  - Dr.  Kowalski: [phone]  - Dra. Moye: [phone]  - Dra. Stewart: [phone]  En caso de inclemencias del tiempo, por favor llame a nuestra lnea principal al [phone] para una actualizacin sobre el estado de cualquier retraso o cierre.  Consejos para la medicacin en dermatologa: Por favor, guarde las cajas en las que vienen los medicamentos de uso tpico para ayudarle a seguir las instrucciones sobre dnde y cmo usarlos. Las farmacias generalmente imprimen las instrucciones del medicamento slo en las cajas y no directamente en los tubos del medicamento.   Si su medicamento es muy caro, por favor, pngase en contacto con nuestra oficina llamando al [phone] y presione la opcin 4 o envenos un mensaje a travs de MyChart.   No podemos decirle cul ser su copago por los medicamentos por adelantado ya que esto es diferente dependiendo de la cobertura de su seguro. Sin embargo, es posible que podamos encontrar un medicamento sustituto a menor costo o llenar un formulario para que el seguro cubra el medicamento que se considera necesario.   Si se requiere una autorizacin previa para que su compaa de seguros cubra su medicamento, por favor permtanos de 1 a 2 das hbiles para completar este proceso.  Los precios de los medicamentos varan con frecuencia dependiendo del lugar de dnde se surte la receta y alguna farmacias pueden ofrecer precios ms baratos.  El sitio web www.goodrx.com tiene cupones para medicamentos de diferentes farmacias. Los precios aqu no tienen en cuenta lo que podra costar con la ayuda del seguro (puede ser ms barato con su seguro), pero el sitio web puede darle el precio si no utiliz ningn seguro.  - Puede imprimir el cupn correspondiente y llevarlo con su receta a la farmacia.  - Tambin puede pasar por nuestra oficina durante el horario de atencin regular y recoger una tarjeta de cupones de GoodRx.  - Si necesita que su receta se enve  electrnicamente a una farmacia diferente, informe a nuestra oficina a travs de MyChart de [place] o por telfono llamando al [phone] y presione la opcin 4.  

## 2021-11-25 NOTE — Progress Notes (Signed)
Follow-Up Visit   Subjective  Richard Acosta is a 86 y.o. male who presents for the following: Actinic Keratosis (Face and scalp - S/P LN2 and 5FU/Calcipotriene cream BID x 7 days on the scalp, patient here today for recheck). The patient has spots, moles and lesions to be evaluated, some may be new or changing.  The following portions of the chart were reviewed this encounter and updated as appropriate:   Tobacco  Allergies  Meds  Problems  Med Hx  Surg Hx  Fam Hx     Review of Systems:  No other skin or systemic complaints except as noted in HPI or Assessment and Plan.  Objective  Well appearing patient in no apparent distress; mood and affect are within normal limits.  A focused examination was performed including the face, ears, and scalp. Relevant physical exam findings are noted in the Assessment and Plan.  Scalp x 19 (19) Erythematous thin papules/macules with gritty scale.   Scalp x 2 (2) Erythematous stuck-on, waxy papule or plaque   Assessment & Plan  AK (actinic keratosis) (19) Scalp x 19  Destruction of lesion - Scalp x 19 Complexity: simple   Destruction method: cryotherapy   Informed consent: discussed and consent obtained   Timeout:  patient name, date of birth, surgical site, and procedure verified Lesion destroyed using liquid nitrogen: Yes   Region frozen until ice ball extended beyond lesion: Yes   Outcome: patient tolerated procedure well with no complications   Post-procedure details: wound care instructions given    Related Medications fluorouracil (EFUDEX) 5 % cream Starting September 15th apply to the scalp BID x 7 days.  Inflamed seborrheic keratosis (2) Scalp x 2  Destruction of lesion - Scalp x 2 Complexity: simple   Destruction method: cryotherapy   Informed consent: discussed and consent obtained   Timeout:  patient name, date of birth, surgical site, and procedure verified Lesion destroyed using liquid nitrogen: Yes   Region  frozen until ice ball extended beyond lesion: Yes   Outcome: patient tolerated procedure well with no complications   Post-procedure details: wound care instructions given     Actinic Damage - Severe, confluent actinic changes with pre-cancerous actinic keratoses  - Severe, chronic, not at goal, secondary to cumulative UV radiation exposure over time - diffuse scaly erythematous macules and papules with underlying dyspigmentation - Discussed Prescription "Field Treatment" for Severe, Chronic Confluent Actinic Changes with Pre-Cancerous Actinic Keratoses Field treatment involves treatment of an entire area of skin that has confluent Actinic Changes (Sun/ Ultraviolet light damage) and PreCancerous Actinic Keratoses by method of PhotoDynamic Therapy (PDT) and/or prescription Topical Chemotherapy agents such as 5-fluorouracil, 5-fluorouracil/calcipotriene, and/or imiquimod.  The purpose is to decrease the number of clinically evident and subclinical PreCancerous lesions to prevent progression to development of skin cancer by chemically destroying early precancer changes that may or may not be visible.  It has been shown to reduce the risk of developing skin cancer in the treated area. As a result of treatment, redness, scaling, crusting, and open sores may occur during treatment course. One or more than one of these methods may be used and may have to be used several times to control, suppress and eliminate the PreCancerous changes. Discussed treatment course, expected reaction, and possible side effects. - Recommend daily broad spectrum sunscreen SPF 30+ to sun-exposed areas, reapply every 2 hours as needed.  - Staying in the shade or wearing long sleeves, sun glasses (UVA+UVB protection) and wide brim hats (  4-inch brim around the entire circumference of the hat) are also recommended. - Call for new or changing lesions.  -Starting 12/27/21 apply 5FU/Calcipotriene BID x 7 days to the scalp.  History of  Basal Cell Carcinoma of the Skin - No evidence of recurrence today - Recommend regular full body skin exams - Recommend daily broad spectrum sunscreen SPF 30+ to sun-exposed areas, reapply every 2 hours as needed.  - Call if any new or changing lesions are noted between office visits  History of Squamous Cell Carcinoma of the Skin - No evidence of recurrence today - No lymphadenopathy - Recommend regular full body skin exams - Recommend daily broad spectrum sunscreen SPF 30+ to sun-exposed areas, reapply every 2 hours as needed.  - Call if any new or changing lesions are noted between office visits  Seborrheic Keratoses - Stuck-on, waxy, tan-brown papules and/or plaques  - Benign-appearing - Discussed benign etiology and prognosis. - Observe - Call for any changes  Return in about 6 months (around 05/28/2022) for TBSE.  Luther Redo, CMA, am acting as scribe for Sarina Ser, MD . Documentation: I have reviewed the above documentation for accuracy and completeness, and I agree with the above.  Sarina Ser, MD

## 2021-12-02 ENCOUNTER — Encounter: Payer: Self-pay | Admitting: Dermatology

## 2022-06-02 ENCOUNTER — Ambulatory Visit (INDEPENDENT_AMBULATORY_CARE_PROVIDER_SITE_OTHER): Payer: Medicare HMO | Admitting: Dermatology

## 2022-06-02 VITALS — BP 127/79 | HR 75

## 2022-06-02 DIAGNOSIS — L820 Inflamed seborrheic keratosis: Secondary | ICD-10-CM

## 2022-06-02 DIAGNOSIS — L578 Other skin changes due to chronic exposure to nonionizing radiation: Secondary | ICD-10-CM

## 2022-06-02 DIAGNOSIS — Z85828 Personal history of other malignant neoplasm of skin: Secondary | ICD-10-CM

## 2022-06-02 DIAGNOSIS — Q809 Congenital ichthyosis, unspecified: Secondary | ICD-10-CM | POA: Diagnosis not present

## 2022-06-02 DIAGNOSIS — L814 Other melanin hyperpigmentation: Secondary | ICD-10-CM | POA: Diagnosis not present

## 2022-06-02 DIAGNOSIS — L570 Actinic keratosis: Secondary | ICD-10-CM

## 2022-06-02 DIAGNOSIS — D229 Melanocytic nevi, unspecified: Secondary | ICD-10-CM

## 2022-06-02 DIAGNOSIS — L821 Other seborrheic keratosis: Secondary | ICD-10-CM

## 2022-06-02 DIAGNOSIS — Z1283 Encounter for screening for malignant neoplasm of skin: Secondary | ICD-10-CM

## 2022-06-02 DIAGNOSIS — Z8589 Personal history of malignant neoplasm of other organs and systems: Secondary | ICD-10-CM

## 2022-06-02 NOTE — Patient Instructions (Addendum)
Due to recent changes in healthcare laws, you may see results of your pathology and/or laboratory studies on MyChart before the doctors have had a chance to review them. We understand that in some cases there may be results that are confusing or concerning to you. Please understand that not all results are received at the same time and often the doctors may need to interpret multiple results in order to provide you with the best plan of care or course of treatment. Therefore, we ask that you please give us 2 business days to thoroughly review all your results before contacting the office for clarification. Should we see a critical lab result, you will be contacted sooner.   If You Need Anything After Your Visit  If you have any questions or concerns for your doctor, please call our main line at [phone] and press option 4 to reach your doctor's medical assistant. If no one answers, please leave a voicemail as directed and we will return your call as soon as possible. Messages left after 4 pm will be answered the following business day.   You may also send us a message via MyChart. We typically respond to MyChart messages within 1-2 business days.  For prescription refills, please ask your pharmacy to contact our office. Our fax number is [phone].  If you have an urgent issue when the clinic is closed that cannot wait until the next business day, you can page your doctor at the number below.    Please note that while we do our best to be available for urgent issues outside of office hours, we are not available 24/7.   If you have an urgent issue and are unable to reach us, you may choose to seek medical care at your doctor's office, retail clinic, urgent care center, or emergency room.  If you have a medical emergency, please immediately call 911 or go to the emergency department.  Pager Numbers  - Dr. Kowalski: [phone]  - Dr. Moye: [phone]  - Dr. Stewart:  [phone]  In the event of inclement weather, please call our main line at [phone] for an update on the status of any delays or closures.  Dermatology Medication Tips: Please keep the boxes that topical medications come in in order to help keep track of the instructions about where and how to use these. Pharmacies typically print the medication instructions only on the boxes and not directly on the medication tubes.   If your medication is too expensive, please contact our office at [phone] option 4 or send us a message through MyChart.   We are unable to tell what your co-pay for medications will be in advance as this is different depending on your insurance coverage. However, we may be able to find a substitute medication at lower cost or fill out paperwork to get insurance to cover a needed medication.   If a prior authorization is required to get your medication covered by your insurance company, please allow us 1-2 business days to complete this process.  Drug prices often vary depending on where the prescription is filled and some pharmacies may offer cheaper prices.  The website www.goodrx.com contains coupons for medications through different pharmacies. The prices here do not account for what the cost may be with help from insurance (it may be cheaper with your insurance), but the website can give you the price if you did not use any insurance.  - You can print the associated coupon and take it with   your prescription to the pharmacy.  - You may also stop by our office during regular business hours and pick up a GoodRx coupon card.  - If you need your prescription sent electronically to a different pharmacy, notify our office through [facility] MyChart or by phone at [phone] option 4.     Si Usted Necesita Algo Despus de Su Visita  Tambin puede enviarnos un mensaje a travs de MyChart. Por lo general respondemos a los mensajes de MyChart en el transcurso de 1 a 2  das hbiles.  Para renovar recetas, por favor pida a su farmacia que se ponga en contacto con nuestra oficina. Nuestro nmero de fax es el [phone].  Si tiene un asunto urgente cuando la clnica est cerrada y que no puede esperar hasta el siguiente da hbil, puede llamar/localizar a su doctor(a) al nmero que aparece a continuacin.   Por favor, tenga en cuenta que aunque hacemos todo lo posible para estar disponibles para asuntos urgentes fuera del horario de oficina, no estamos disponibles las 24 horas del da, los 7 das de la semana.   Si tiene un problema urgente y no puede comunicarse con nosotros, puede optar por buscar atencin mdica  en el consultorio de su doctor(a), en una clnica privada, en un centro de atencin urgente o en una sala de emergencias.  Si tiene una emergencia mdica, por favor llame inmediatamente al 911 o vaya a la sala de emergencias.  Nmeros de bper  - Dr. Kowalski: [phone]  - Dra. Moye: [phone]  - Dra. Stewart: [phone]  En caso de inclemencias del tiempo, por favor llame a nuestra lnea principal al [phone] para una actualizacin sobre el estado de cualquier retraso o cierre.  Consejos para la medicacin en dermatologa: Por favor, guarde las cajas en las que vienen los medicamentos de uso tpico para ayudarle a seguir las instrucciones sobre dnde y cmo usarlos. Las farmacias generalmente imprimen las instrucciones del medicamento slo en las cajas y no directamente en los tubos del medicamento.   Si su medicamento es muy caro, por favor, pngase en contacto con nuestra oficina llamando al [phone] y presione la opcin 4 o envenos un mensaje a travs de MyChart.   No podemos decirle cul ser su copago por los medicamentos por adelantado ya que esto es diferente dependiendo de la cobertura de su seguro. Sin embargo, es posible que podamos encontrar un medicamento sustituto a menor costo o llenar un formulario para que el  seguro cubra el medicamento que se considera necesario.   Si se requiere una autorizacin previa para que su compaa de seguros cubra su medicamento, por favor permtanos de 1 a 2 das hbiles para completar este proceso.  Los precios de los medicamentos varan con frecuencia dependiendo del lugar de dnde se surte la receta y alguna farmacias pueden ofrecer precios ms baratos.  El sitio web www.goodrx.com tiene cupones para medicamentos de diferentes farmacias. Los precios aqu no tienen en cuenta lo que podra costar con la ayuda del seguro (puede ser ms barato con su seguro), pero el sitio web puede darle el precio si no utiliz ningn seguro.  - Puede imprimir el cupn correspondiente y llevarlo con su receta a la farmacia.  - Tambin puede pasar por nuestra oficina durante el horario de atencin regular y recoger una tarjeta de cupones de GoodRx.  - Si necesita que su receta se enve electrnicamente a una farmacia diferente, informe a nuestra oficina a travs de MyChart de [place]   o por telfono llamando al [phone] y presione la opcin 4.  Recommend CeraVe cream moisturizer daily after the shower.    Gentle Skin Care Guide  1. Bathe no more than once a day.  2. Avoid bathing in hot water  3. Use a mild soap like Dove, Vanicream, Cetaphil, CeraVe. Can use Lever 2000 or Cetaphil antibacterial soap  4. Use soap only where you need it. On most days, use it under your arms, between your legs, and on your feet. Let the water rinse other areas unless visibly dirty.  5. When you get out of the bath/shower, use a towel to gently blot your skin dry, don't rub it.  6. While your skin is still a little damp, apply a moisturizing cream such as Vanicream, CeraVe, Cetaphil, Eucerin, Sarna lotion or plain Vaseline Jelly. For hands apply Neutrogena [place] Hand Cream or Excipial Hand Cream.  7. Reapply moisturizer any time you start to itch or feel dry.  8. Sometimes using free and  clear laundry detergents can be helpful. Fabric softener sheets should be avoided. Downy Free & Gentle liquid, or any liquid fabric softener that is free of dyes and perfumes, it acceptable to use  9. If your doctor has given you prescription creams you may apply moisturizers over them

## 2022-06-02 NOTE — Progress Notes (Unsigned)
Follow-Up Visit   Subjective  Richard Acosta is a 87 y.o. male who presents for the following: Annual Exam (Hx of BCC, SCC, Aks, and ISKs ).The patient presents for Total-Body Skin Exam (TBSE) for skin cancer screening and mole check.  The patient has spots, moles and lesions to be evaluated, some may be new or changing and the patient has concerns that these could be cancer.  The following portions of the chart were reviewed this encounter and updated as appropriate:   Tobacco  Allergies  Meds  Problems  Med Hx  Surg Hx  Fam Hx     Review of Systems:  No other skin or systemic complaints except as noted in HPI or Assessment and Plan.  Objective  Well appearing patient in no apparent distress; mood and affect are within normal limits.  A full examination was performed including scalp, head, eyes, ears, nose, lips, neck, chest, axillae, abdomen, back, buttocks, bilateral upper extremities, bilateral lower extremities, hands, feet, fingers, toes, fingernails, and toenails. All findings within normal limits unless otherwise noted below.  Scalp x 16 (16) Erythematous thin papules/macules with gritty scale.   back x 7 (7) Erythematous stuck-on, waxy papule or plaque   Assessment & Plan  Ichthyosis Trunk, extremities Severe xerosis -  - diffuse xerotic patches. - recommend gentle, hydrating skin care, recommend CeraVe cream moisturizer Qod alternating with Amlactin Rapid Relief lotion qod. - gentle skin care handout given.  AK (actinic keratosis) (16) Scalp x 16 Destruction of lesion - Scalp x 16 Complexity: simple   Destruction method: cryotherapy   Informed consent: discussed and consent obtained   Timeout:  patient name, date of birth, surgical site, and procedure verified Lesion destroyed using liquid nitrogen: Yes   Region frozen until ice ball extended beyond lesion: Yes   Outcome: patient tolerated procedure well with no complications   Post-procedure details:  wound care instructions given    Related Medications fluorouracil (EFUDEX) 5 % cream Starting September 15th apply to the scalp BID x 7 days.  Inflamed seborrheic keratosis (7) back x 7 Symptomatic, irritating, patient would like treated. Destruction of lesion - back x 7 Complexity: simple   Destruction method: cryotherapy   Informed consent: discussed and consent obtained   Timeout:  patient name, date of birth, surgical site, and procedure verified Lesion destroyed using liquid nitrogen: Yes   Region frozen until ice ball extended beyond lesion: Yes   Outcome: patient tolerated procedure well with no complications   Post-procedure details: wound care instructions given    Lentigines - Scattered tan macules - Due to sun exposure - Benign-appearing, observe - Recommend daily broad spectrum sunscreen SPF 30+ to sun-exposed areas, reapply every 2 hours as needed. - Call for any changes  Seborrheic Keratoses - Stuck-on, waxy, tan-brown papules and/or plaques  - Benign-appearing - Discussed benign etiology and prognosis. - Observe - Call for any changes  Melanocytic Nevi - Tan-brown and/or pink-flesh-colored symmetric macules and papules - Benign appearing on exam today - Observation - Call clinic for new or changing moles - Recommend daily use of broad spectrum spf 30+ sunscreen to sun-exposed areas.   Hemangiomas - Red papules - Discussed benign nature - Observe - Call for any changes  Actinic Damage - Chronic condition, secondary to cumulative UV/sun exposure - diffuse scaly erythematous macules with underlying dyspigmentation - Recommend daily broad spectrum sunscreen SPF 30+ to sun-exposed areas, reapply every 2 hours as needed.  - Staying in the shade or wearing  long sleeves, sun glasses (UVA+UVB protection) and wide brim hats (4-inch brim around the entire circumference of the hat) are also recommended for sun protection.  - Call for new or changing  lesions.  History of Basal Cell Carcinoma of the Skin - No evidence of recurrence today - Recommend regular full body skin exams - Recommend daily broad spectrum sunscreen SPF 30+ to sun-exposed areas, reapply every 2 hours as needed.  - Call if any new or changing lesions are noted between office visits  History of Squamous Cell Carcinoma of the Skin - No evidence of recurrence today - No lymphadenopathy - Recommend regular full body skin exams - Recommend daily broad spectrum sunscreen SPF 30+ to sun-exposed areas, reapply every 2 hours as needed.  - Call if any new or changing lesions are noted between office visits  Skin cancer screening performed today.  Return in about 1 year (around 06/03/2023) for TBSE.  Luther Redo, CMA, am acting as scribe for Sarina Ser, MD . Documentation: I have reviewed the above documentation for accuracy and completeness, and I agree with the above.  Sarina Ser, MD

## 2022-06-04 ENCOUNTER — Encounter: Payer: Self-pay | Admitting: Dermatology

## 2022-12-31 ENCOUNTER — Ambulatory Visit: Payer: Medicare HMO | Attending: Cardiology | Admitting: Cardiology

## 2022-12-31 ENCOUNTER — Encounter: Payer: Self-pay | Admitting: Cardiology

## 2022-12-31 VITALS — BP 138/78 | HR 65 | Ht 65.0 in | Wt 168.6 lb

## 2022-12-31 DIAGNOSIS — E785 Hyperlipidemia, unspecified: Secondary | ICD-10-CM | POA: Diagnosis not present

## 2022-12-31 DIAGNOSIS — R0602 Shortness of breath: Secondary | ICD-10-CM

## 2022-12-31 NOTE — Patient Instructions (Signed)
Medication Instructions:  The current medical regimen is effective;  continue present plan and medications.  *If you need a refill on your cardiac medications before your next appointment, please call your pharmacy*  Testing/Procedures: Your physician has requested that you have an echocardiogram. Echocardiography is a painless test that uses sound waves to create images of your heart. It provides your doctor with information about the size and shape of your heart and how well your heart's chambers and valves are working. This procedure takes approximately one hour. There are no restrictions for this procedure. Please do NOT wear cologne, perfume, aftershave, or lotions (deodorant is allowed). Please arrive 15 minutes prior to your appointment time.  Follow-Up: At [facility], you and your health needs are our priority.  As part of our continuing mission to provide you with exceptional heart care, we have created designated Provider Care Teams.  These Care Teams include your primary Cardiologist (physician) and Advanced Practice Providers (APPs -  Physician Assistants and Nurse Practitioners) who all work together to provide you with the care you need, when you need it.  We recommend signing up for the patient portal called "MyChart".  Sign up information is provided on this After Visit Summary.  MyChart is used to connect with patients for Virtual Visits (Telemedicine).  Patients are able to view lab/test results, encounter notes, upcoming appointments, etc.  Non-urgent messages can be sent to your provider as well.   To learn more about what you can do with MyChart, go to [email or web address].    Your next appointment:   1 year(s)  Provider:   Dr Donato Schultz

## 2022-12-31 NOTE — Progress Notes (Signed)
Cardiology Office Note:    Date:  12/31/2022   ID:  Richard Acosta, DOB [birth year], MRN [id number]  PCP:  Gracelyn Nurse, MD   [facility] Providers Cardiologist:  Donato Schultz, MD     Referring MD: Gracelyn Nurse, MD    History of Present Illness:    Richard Acosta is a 87 y.o. male  Discussed the use of AI scribe software   History of Present Illness   The patient, with a history of syncope and resting tremor, presents with shortness of breath on exertion. The patient reports feeling winded after walking short distances or climbing stairs. This symptom has developed gradually over time. The patient's last echocardiogram in 2019 showed normal heart function. The patient has a history of diabetes, with a recent HbA1c of 8.7, indicating suboptimal glycemic control. The patient's lipid profile shows elevated LDL and triglycerides. The patient also has a history of hypertension, managed with lisinopril. The patient quit smoking over 60 years ago. The patient also reports a history of appendectomy 1954 and cataract surgery. The patient is also being monitored for a pituitary tumor.       Past Medical History:  Diagnosis Date   Actinic keratosis    Basal cell carcinoma 03/23/2014   Left ant lat top of shoulder   Basal cell carcinoma 02/13/2016   Right mastoid postauricular   Diabetes mellitus without complication (HCC)    Hyperlipidemia    Hypertension    Hypothyroidism    Squamous cell carcinoma of skin 02/04/2017   Left lat elbow (in situ)   Squamous cell carcinoma of skin 12/12/2019   crown scalp    Past Surgical History:  Procedure Laterality Date   APPENDECTOMY      Current Medications: Current Meds  Medication Sig   acetaminophen (TYLENOL) 650 MG CR tablet 1 tablet as needed every 8 hours for joint pain.  270 tablets for 90 days   carbidopa-levodopa (SINEMET IR) 25-100 MG tablet Take 1 tablet by mouth 3 (three) times daily.   cetirizine (ZYRTEC) 10 MG  tablet Take by mouth.   ezetimibe-simvastatin (VYTORIN) 10-40 MG tablet Take 1 tablet by mouth daily.   feeding supplement, ENSURE ENLIVE, (ENSURE ENLIVE) LIQD Take 237 mLs by mouth 2 (two) times daily between meals.   fluorouracil (EFUDEX) 5 % cream Starting September 15th apply to the scalp BID x 7 days.   levothyroxine (SYNTHROID, LEVOTHROID) 50 MCG tablet Take 50 mcg by mouth daily before breakfast.   lisinopril (PRINIVIL,ZESTRIL) 20 MG tablet Take 20 mg by mouth daily.   meloxicam (MOBIC) 15 MG tablet Take 1 tablet (15 mg total) by mouth daily.   metFORMIN (GLUCOPHAGE) 1000 MG tablet Take 1,000 mg by mouth daily with breakfast.   Multiple Vitamin (MULTIVITAMIN WITH MINERALS) TABS tablet Take 1 tablet by mouth daily.   sodium chloride 1 g tablet Take 1 tablet (1 g total) by mouth 2 (two) times daily with a meal.   tamsulosin (FLOMAX) 0.4 MG CAPS capsule Take 0.4 mg by mouth daily after supper.   timolol (BETIMOL) 0.5 % ophthalmic solution Place 1 drop into both eyes at bedtime.      Allergies:   Latex   Social History   Socioeconomic History   Marital status: Married    Spouse name: Not on file   Number of children: Not on file   Years of education: Not on file   Highest education level: Not on file  Occupational History  Not on file  Tobacco Use   Smoking status: Former   Smokeless tobacco: Never   Tobacco comments:    quit about 50 years ago  Vaping Use   Vaping status: Never Used  Substance and Sexual Activity   Alcohol use: Not Currently    Alcohol/week: 2.0 standard drinks of alcohol    Types: 2 Glasses of wine per week   Drug use: Not Currently   Sexual activity: Not on file  Other Topics Concern   Not on file  Social History Narrative   Lives at home with Family   Independent at baseline   Social Determinants of Health   Financial Resource Strain: Not on file  Food Insecurity: Not on file  Transportation Needs: Not on file  Physical Activity: Not on file   Stress: Not on file  Social Connections: Not on file     Family History: The patient's family history includes Diabetes in his father and mother.  ROS:   Please see the history of present illness.    No syncope All other systems reviewed and are negative.  EKGs/Labs/Other Studies Reviewed:    The following studies were reviewed today: EKG Interpretation Date/Time:  Wednesday December 31 2022 13:18:01 EDT Ventricular Rate:  71 PR Interval:  146 QRS Duration:  86 QT Interval:  426 QTC Calculation: 462 R Axis:   20  Text Interpretation: Sinus rhythm with occasional Premature ventricular complexes When compared with ECG of 23-Jul-2017 08:04, Premature ventricular complexes are now present Confirmed by Donato Schultz ([id number]) on 12/31/2022 1:40:51 PM     Recent Labs: No results found for requested labs within last 365 days.  Recent Lipid Panel No results found for: "CHOL", "TRIG", "HDL", "CHOLHDL", "VLDL", "LDLCALC", "LDLDIRECT"  LABS LDL: 145 (08/18/2022) Triglycerides: 284 (08/18/2022) Creatinine: 1.5 (08/18/2022) Hemoglobin A1c: 8.7 (08/18/2022) Hemoglobin: 14.3 (08/18/2022)  RADIOLOGY Carotid Doppler: No evidence of disease (01/15/2022)  DIAGNOSTIC Echocardiogram: EF 60-65%, grade one diastolic dysfunction, mild mitral regurgitation, mildly dilated left atrium (07/24/2017) Nuclear stress test: No ischemia (01/15/2022) Holter monitor: One run of 11 beats of supraventricular tachycardia (SVT) (01/15/2022)  Risk Assessment/Calculations:               Physical Exam:    VS:  BP 138/78   Pulse 65   Ht 5\' 5"  (1.651 m)   Wt 168 lb 9.6 oz (76.5 kg)   SpO2 95%   BMI 28.06 kg/m     Wt Readings from Last 3 Encounters:  12/31/22 168 lb 9.6 oz (76.5 kg)  08/02/17 162 lb 11.2 oz (73.8 kg)  07/23/17 157 lb (71.2 kg)     GEN:  Well nourished, well developed in no acute distress HEENT: Normal NECK: No JVD; No carotid bruits LYMPHATICS: No lymphadenopathy CARDIAC:  RRR, no murmurs, rubs, gallops RESPIRATORY:  Clear to auscultation without rales, wheezing or rhonchi  ABDOMEN: Soft, non-tender, non-distended MUSCULOSKELETAL:  No edema; No deformity  SKIN: Warm and dry NEUROLOGIC:  Alert and oriented x 3, Parkinson's like tremor PSYCHIATRIC:  Normal affect   ASSESSMENT:    1. SOB (shortness of breath)   2. Hyperlipidemia, unspecified hyperlipidemia type    PLAN:    In order of problems listed above:  Assessment and Plan    Shortness of Breath Fairly recent progression/onset, worsens with exertion. Last echocardiogram in 2019 showed normal EF and mild mitral regurgitation. -Order repeat echocardiogram to assess for changes in cardiac function. -Encourage daily exercise to improve stamina.  Hyperlipidemia LDL elevated at 145,  on Vytorin 10/40. -Continue Vytorin 10/40.  Diabetes Mellitus Hemoglobin A1c elevated at 8.7, indicating suboptimal glycemic control. -Continue management with primary care physician, Dr. Letitia Libra.  Hypertension Blood pressure 138/78, on Lisinopril 20mg . -Continue Lisinopril 20mg  daily.  Tremor Possible change in tremor pattern, suggestive of Parkinson's disease. -Continue follow-up with neurologist and neurosurgeon.  Pituitary Tumor Under surveillance. -Continue follow-up with neurosurgeon.  Syncope No recent episodes. Previous workup including carotid Doppler, nuclear stress test, and Holter monitor were normal. -No further action needed at this time.                Medication Adjustments/Labs and Tests Ordered: Current medicines are reviewed at length with the patient today.  Concerns regarding medicines are outlined above.  Orders Placed This Encounter  Procedures   EKG 12-Lead   ECHOCARDIOGRAM COMPLETE   No orders of the defined types were placed in this encounter.   Patient Instructions  Medication Instructions:  The current medical regimen is effective;  continue present plan and  medications.  *If you need a refill on your cardiac medications before your next appointment, please call your pharmacy*  Testing/Procedures: Your physician has requested that you have an echocardiogram. Echocardiography is a painless test that uses sound waves to create images of your heart. It provides your doctor with information about the size and shape of your heart and how well your heart's chambers and valves are working. This procedure takes approximately one hour. There are no restrictions for this procedure. Please do NOT wear cologne, perfume, aftershave, or lotions (deodorant is allowed). Please arrive 15 minutes prior to your appointment time.   Follow-Up: At [facility], you and your health needs are our priority.  As part of our continuing mission to provide you with exceptional heart care, we have created designated Provider Care Teams.  These Care Teams include your primary Cardiologist (physician) and Advanced Practice Providers (APPs -  Physician Assistants and Nurse Practitioners) who all work together to provide you with the care you need, when you need it.  We recommend signing up for the patient portal called "MyChart".  Sign up information is provided on this After Visit Summary.  MyChart is used to connect with patients for Virtual Visits (Telemedicine).  Patients are able to view lab/test results, encounter notes, upcoming appointments, etc.  Non-urgent messages can be sent to your provider as well.   To learn more about what you can do with MyChart, go to [email or web address].    Your next appointment:   1 year(s)  Provider:   Dr Donato Schultz        Signed, Donato Schultz, MD  12/31/2022 1:57 PM    [facility]

## 2023-01-27 ENCOUNTER — Ambulatory Visit: Payer: Medicare HMO | Attending: Cardiology

## 2023-01-27 DIAGNOSIS — R0602 Shortness of breath: Secondary | ICD-10-CM | POA: Diagnosis not present

## 2023-01-28 LAB — ECHOCARDIOGRAM COMPLETE
Area-P 1/2: 2.73 cm2
S' Lateral: 2 cm

## 2023-01-30 ENCOUNTER — Telehealth: Payer: Self-pay | Admitting: Cardiology

## 2023-01-30 NOTE — Telephone Encounter (Signed)
Wife Inocencio Homes) called to follow up on test results.

## 2023-01-30 NOTE — Telephone Encounter (Signed)
Jake Bathe, MD 01/29/2023  7:32 AM EDT     Normal pump function. Reassuring ECHO. No significant mitral regurgitation.   Returned call to wife and gave above information per verbal okay/DPR. No further questions.

## 2023-02-09 ENCOUNTER — Emergency Department
Admission: EM | Admit: 2023-02-09 | Discharge: 2023-02-09 | Disposition: A | Discharge status: Home / Self Care | Payer: Medicare HMO | Attending: Emergency Medicine | Admitting: Emergency Medicine

## 2023-02-09 ENCOUNTER — Other Ambulatory Visit: Payer: Self-pay

## 2023-02-09 ENCOUNTER — Emergency Department: Payer: Medicare HMO

## 2023-02-09 DIAGNOSIS — R531 Weakness: Secondary | ICD-10-CM | POA: Diagnosis present

## 2023-02-09 DIAGNOSIS — R630 Anorexia: Secondary | ICD-10-CM | POA: Insufficient documentation

## 2023-02-09 DIAGNOSIS — E119 Type 2 diabetes mellitus without complications: Secondary | ICD-10-CM | POA: Diagnosis not present

## 2023-02-09 DIAGNOSIS — I10 Essential (primary) hypertension: Secondary | ICD-10-CM | POA: Insufficient documentation

## 2023-02-09 LAB — COMPREHENSIVE METABOLIC PANEL
ALT: 7 U/L (ref 0–44)
AST: 23 U/L (ref 15–41)
Albumin: 3.4 g/dL — ABNORMAL LOW (ref 3.5–5.0)
Alkaline Phosphatase: 67 U/L (ref 38–126)
Anion gap: 9 (ref 5–15)
BUN: 25 mg/dL — ABNORMAL HIGH (ref 8–23)
CO2: 24 mmol/L (ref 22–32)
Calcium: 9.2 mg/dL (ref 8.9–10.3)
Chloride: 102 mmol/L (ref 98–111)
Creatinine, Ser: 1.32 mg/dL — ABNORMAL HIGH (ref 0.61–1.24)
GFR, Estimated: 53 mL/min — ABNORMAL LOW (ref >60)
Glucose, Bld: 154 mg/dL — ABNORMAL HIGH (ref 70–99)
Potassium: 4 mmol/L (ref 3.5–5.1)
Sodium: 135 mmol/L (ref 135–145)
Total Bilirubin: 0.9 mg/dL (ref 0.3–1.2)
Total Protein: 6.7 g/dL (ref 6.5–8.1)

## 2023-02-09 LAB — URINALYSIS, W/ REFLEX TO CULTURE (INFECTION SUSPECTED)
Bacteria, UA: NONE SEEN
Bilirubin Urine: NEGATIVE
Glucose, UA: NEGATIVE mg/dL
Hgb urine dipstick: NEGATIVE
Ketones, ur: 5 mg/dL — AB
Nitrite: NEGATIVE
Protein, ur: NEGATIVE mg/dL
Specific Gravity, Urine: 1.016 (ref 1.005–1.030)
pH: 5 (ref 5.0–8.0)

## 2023-02-09 LAB — CBC
HCT: 41.9 % (ref 39.0–52.0)
Hemoglobin: 14.3 g/dL (ref 13.0–17.0)
MCH: 29.8 pg (ref 26.0–34.0)
MCHC: 34.1 g/dL (ref 30.0–36.0)
MCV: 87.3 fL (ref 80.0–100.0)
Platelets: 235 10*3/uL (ref 150–400)
RBC: 4.8 MIL/uL (ref 4.22–5.81)
RDW: 12.9 % (ref 11.5–15.5)
WBC: 10.4 10*3/uL (ref 4.0–10.5)
nRBC: 0 % (ref 0.0–0.2)

## 2023-02-09 MED ORDER — CARBIDOPA-LEVODOPA 25-250 MG PO TABS
0.5000 | ORAL_TABLET | ORAL | Status: AC
  Administered 2023-02-09: 0.5 via ORAL
  Filled 2023-02-09: qty 1

## 2023-02-09 NOTE — Discharge Instructions (Signed)
Your CT scan of the brain and lab tests today were all okay.   Decrease Sinemet to one-half tablet per dose, and call Dr. Margaretmary Eddy office tomorrow for a follow up appointment this week.

## 2023-02-09 NOTE — ED Triage Notes (Signed)
Pt to ED for emesis, difficulty walking for a couple weeks. Currently being dx with parkinsons, family states thinks having rxn to carbidopa levodopa. NAD noted. Alert and oriented

## 2023-02-09 NOTE — ED Provider Notes (Signed)
[facility] Provider Note    Event Date/Time   First MD Initiated Contact with Patient 02/09/23 1031     (approximate)   History   Chief Complaint: Emesis   HPI  Richard Acosta is a 87 y.o. male with a history of hypertension, diabetes, pituitary adenoma who is brought to the ED due to generalized weakness, loss of appetit, over the past 2 weeks.  Family also notes occasional vomiting and sweating.  No fever.  Patient was started on Sinemet a few months ago.  Patient denies pain, no chest pain shortness of breath or dysuria.  Physical Exam   Triage Vital Signs: ED Triage Vitals  Encounter Vitals Group     BP 02/09/23 1027 (!) 167/67     Systolic BP Percentile --      Diastolic BP Percentile --      Pulse Rate 02/09/23 1027 69     Resp 02/09/23 1027 20     Temp 02/09/23 1027 98.1 F (36.7 C)     Temp src --      SpO2 02/09/23 1027 99 %     Weight 02/09/23 1028 164 lb (74.4 kg)     Height 02/09/23 1028 5\' 5"  (1.651 m)     Head Circumference --      Peak Flow --      Pain Score 02/09/23 1027 0     Pain Loc --      Pain Education --      Exclude from Growth Chart --     Most recent vital signs: Vitals:   02/09/23 1100 02/09/23 1130  BP: 126/75 130/65  Pulse: 67 63  Resp: 16 15  Temp:    SpO2: 100% 99%    General: Awake, no distress.  CV:  Good peripheral perfusion.  Regular rate rhythm Resp:  Normal effort.  Clear to auscultation bilaterally Abd:  No distention.  Soft nontender Other:  No lower extremity edema.  Moist oral mucosa.   ED Results / Procedures / Treatments   Labs (all labs ordered are listed, but only abnormal results are displayed) Labs Reviewed  COMPREHENSIVE METABOLIC PANEL - Abnormal; Notable for the following components:      Result Value   Glucose, Bld 154 (*)    BUN 25 (*)    Creatinine, Ser 1.32 (*)    Albumin 3.4 (*)    GFR, Estimated 53 (*)    All other components within normal limits  URINALYSIS, W/  REFLEX TO CULTURE (INFECTION SUSPECTED) - Abnormal; Notable for the following components:   Color, Urine YELLOW (*)    APPearance CLEAR (*)    Ketones, ur 5 (*)    Leukocytes,Ua TRACE (*)    All other components within normal limits  CBC     EKG Interpreted by me Sinus rhythm rate of 71.  Normal axis and intervals.  Poor R wave progression.  Normal ST segments and T waves   RADIOLOGY CT head interpreted by me, negative for intracranial hemorrhage or other acute findings.  Radiology report reviewed   PROCEDURES:  Procedures   MEDICATIONS ORDERED IN ED: Medications - No data to display   IMPRESSION / MDM / ASSESSMENT AND PLAN / ED COURSE  I reviewed the triage vital signs and the nursing notes.  DDx: Electrolyte abnormality, AKI, anemia, UTI, worsening Parkinson's symptoms  Patient's presentation is most consistent with acute presentation with potential threat to life or bodily function.  Patient presents with generalized  weakness, described as foot dragging gait, loss of appetite.  Has a fine tremor.  Family suspects this is due to side effects from the Sinemet, which is possible.  Symptoms may also be escalating Parkinson symptoms, requiring a higher dose of Sinemet.  Metabolic workup in the ED is unremarkable.  CT head unremarkable.  Vital signs normal, mental status unremarkable.  Will have family discontinue the Sinemet, follow-up with his neurologist this week for reassessment.       FINAL CLINICAL IMPRESSION(S) / ED DIAGNOSES   Final diagnoses:  Generalized weakness     Rx / DC Orders   ED Discharge Orders     None        Note:  This document was prepared using Dragon voice recognition software and may include unintentional dictation errors.   Sharman Cheek, MD 02/09/23 [phone]

## 2023-03-16 ENCOUNTER — Other Ambulatory Visit: Payer: Self-pay

## 2023-03-16 ENCOUNTER — Emergency Department: Payer: Medicare HMO

## 2023-03-16 ENCOUNTER — Emergency Department
Admission: EM | Admit: 2023-03-16 | Discharge: 2023-03-16 | Disposition: A | Discharge status: Home / Self Care | Payer: Medicare HMO | Attending: Emergency Medicine | Admitting: Emergency Medicine

## 2023-03-16 DIAGNOSIS — E119 Type 2 diabetes mellitus without complications: Secondary | ICD-10-CM | POA: Diagnosis not present

## 2023-03-16 DIAGNOSIS — W19XXXA Unspecified fall, initial encounter: Secondary | ICD-10-CM

## 2023-03-16 DIAGNOSIS — I10 Essential (primary) hypertension: Secondary | ICD-10-CM | POA: Diagnosis not present

## 2023-03-16 DIAGNOSIS — M25552 Pain in left hip: Secondary | ICD-10-CM | POA: Insufficient documentation

## 2023-03-16 DIAGNOSIS — Y9301 Activity, walking, marching and hiking: Secondary | ICD-10-CM | POA: Insufficient documentation

## 2023-03-16 DIAGNOSIS — M25562 Pain in left knee: Secondary | ICD-10-CM | POA: Insufficient documentation

## 2023-03-16 DIAGNOSIS — E039 Hypothyroidism, unspecified: Secondary | ICD-10-CM | POA: Insufficient documentation

## 2023-03-16 DIAGNOSIS — W1839XA Other fall on same level, initial encounter: Secondary | ICD-10-CM | POA: Diagnosis not present

## 2023-03-16 DIAGNOSIS — G20C Parkinsonism, unspecified: Secondary | ICD-10-CM | POA: Insufficient documentation

## 2023-03-16 MED ORDER — HYDROCODONE-ACETAMINOPHEN 5-325 MG PO TABS: 1.0000 | ORAL_TABLET | Freq: Four times a day (QID) | ORAL | 0 refills | Status: AC | PRN

## 2023-03-16 NOTE — ED Provider Notes (Signed)
[facility] Provider Note    Event Date/Time   First MD Initiated Contact with Patient 03/16/23 [phone]     (approximate)   History   Fall   HPI  KAYNON POLCZYNSKI is a 87 y.o. male with history of hypertension, hyperlipidemia, diabetes, hypothyroidism, Parkinson's, pituitary macroadenoma who presents to the emergency department EMS after he fell.  States he was trying to adjust his clock and was walking without his walker when he turned around and lost his balance and fell.  Complaining of pain in the left hip and knee but only when he bears weight.  He is not sure if he hit his head.  He is not on blood thinners.  Took 2 Tylenol prior to arrival.   History provided by patient, family, EMS.    Past Medical History:  Diagnosis Date   Actinic keratosis    Basal cell carcinoma 03/23/2014   Left ant lat top of shoulder   Basal cell carcinoma 02/13/2016   Right mastoid postauricular   Diabetes mellitus without complication (HCC)    Hyperlipidemia    Hypertension    Hypothyroidism    Squamous cell carcinoma of skin 02/04/2017   Left lat elbow (in situ)   Squamous cell carcinoma of skin 12/12/2019   crown scalp    Past Surgical History:  Procedure Laterality Date   APPENDECTOMY      MEDICATIONS:  Prior to Admission medications   Medication Sig Start Date End Date Taking? Authorizing Provider  acetaminophen (TYLENOL) 650 MG CR tablet 1 tablet as needed every 8 hours for joint pain.  270 tablets for 90 days 05/27/19   [provider]  carbidopa-levodopa (SINEMET IR) 25-100 MG tablet Take 1 tablet by mouth 3 (three) times daily. 12/10/22 12/10/23  [provider]  cetirizine (ZYRTEC) 10 MG tablet Take by mouth.    [provider]  DULoxetine (CYMBALTA) 30 MG capsule Take by mouth. 05/27/19 05/26/20  [provider]  ezetimibe-simvastatin (VYTORIN) 10-40 MG tablet Take 1 tablet by mouth daily.    [provider]   feeding supplement, ENSURE ENLIVE, (ENSURE ENLIVE) LIQD Take 237 mLs by mouth 2 (two) times daily between meals. 07/28/17   Enedina Finner, MD  fluorouracil (EFUDEX) 5 % cream Starting September 15th apply to the scalp BID x 7 days. 11/25/21   Deirdre Evener, MD  levothyroxine (SYNTHROID, LEVOTHROID) 50 MCG tablet Take 50 mcg by mouth daily before breakfast.    [provider]  lisinopril (PRINIVIL,ZESTRIL) 20 MG tablet Take 20 mg by mouth daily. 07/15/17   [provider]  meloxicam (MOBIC) 15 MG tablet Take 1 tablet (15 mg total) by mouth daily. 08/03/17   Salary, Evelena Asa, MD  metFORMIN (GLUCOPHAGE) 1000 MG tablet Take 1,000 mg by mouth daily with breakfast.    [provider]  Multiple Vitamin (MULTIVITAMIN WITH MINERALS) TABS tablet Take 1 tablet by mouth daily. 07/29/17   Enedina Finner, MD  sodium chloride 1 g tablet Take 1 tablet (1 g total) by mouth 2 (two) times daily with a meal. 08/02/17   Sainani, Rolly Pancake, MD  tamsulosin (FLOMAX) 0.4 MG CAPS capsule Take 0.4 mg by mouth daily after supper.    [provider]  timolol (BETIMOL) 0.5 % ophthalmic solution Place 1 drop into both eyes at bedtime.     [provider]    Physical Exam   Triage Vital Signs: ED Triage Vitals  Encounter Vitals Group  BP 03/16/23 0044 (!) 162/62     Systolic BP Percentile --      Diastolic BP Percentile --      Pulse Rate 03/16/23 0044 75     Resp 03/16/23 0044 16     Temp 03/16/23 0044 98 F (36.7 C)     Temp Source 03/16/23 0044 Oral     SpO2 03/16/23 0044 98 %     Weight --      Height --      Head Circumference --      Peak Flow --      Pain Score 03/16/23 0042 2     Pain Loc --      Pain Education --      Exclude from Growth Chart --     Most recent vital signs: Vitals:   03/16/23 0044  BP: (!) 162/62  Pulse: 75  Resp: 16  Temp: 98 F (36.7 C)  SpO2: 98%     CONSTITUTIONAL: Alert, responds appropriately to questions. Well-appearing;  well-nourished; GCS 15 HEAD: Normocephalic; atraumatic EYES: Conjunctivae clear, PERRL, EOMI ENT: normal nose; no rhinorrhea; moist mucous membranes; pharynx without lesions noted; no dental injury; no septal hematoma, no epistaxis; no facial deformity or bony tenderness NECK: Supple, no midline spinal tenderness, step-off or deformity; trachea midline CARD: RRR; S1 and S2 appreciated; no murmurs, no clicks, no rubs, no gallops RESP: Normal chest excursion without splinting or tachypnea; breath sounds clear and equal bilaterally; no wheezes, no rhonchi, no rales; no hypoxia or respiratory distress CHEST:  chest wall stable, no crepitus or ecchymosis or deformity, nontender to palpation; no flail chest ABD/GI: Non-distended; soft, non-tender, no rebound, no guarding; no ecchymosis or other lesions noted PELVIS:  stable, nontender to palpation BACK:  The back appears normal; no midline spinal tenderness, step-off or deformity EXT: Normal ROM in all joints; no edema; normal capillary refill; no cyanosis, no bony tenderness or bony deformity of patient's extremities, no joint effusions, compartments are soft, extremities are warm and well-perfused, no ecchymosis, I am able to fully range the hip and the knee on the left side without any pain.  He has 2+ left DP pulse.  No calf tenderness or calf swelling. SKIN: Normal color for age and race; warm NEURO: No facial asymmetry, normal speech, moving all extremities equally  ED Results / Procedures / Treatments   LABS: (all labs ordered are listed, but only abnormal results are displayed) Labs Reviewed - No data to display   EKG:  EKG Interpretation Date/Time:    Ventricular Rate:    PR Interval:    QRS Duration:    QT Interval:    QTC Calculation:   R Axis:      Text Interpretation:            RADIOLOGY: My personal review and interpretation of imaging: Imaging shows no acute traumatic injury.  I have personally reviewed all  radiology reports. CT HEAD WO CONTRAST ( )  Result Date: 03/16/2023 CLINICAL DATA:  Initial evaluation for acute trauma, fall. EXAM: CT HEAD WITHOUT CONTRAST TECHNIQUE: Contiguous axial images were obtained from the base of the skull through the vertex without intravenous contrast. RADIATION DOSE REDUCTION: This exam was performed according to the departmental dose-optimization program which includes automated exposure control, adjustment of the mA and/or kV according to patient size and/or use of iterative reconstruction technique. COMPARISON:  Prior CT from 02/09/2023. FINDINGS: Brain: Generalized age-related cerebral atrophy with chronic small vessel ischemic disease. No acute intracranial  hemorrhage. No acute cortically based infarct. Patient's known sellar/suprasellar mass, consistent with pituitary macro adenoma, grossly stable from prior. No other mass lesion, mass effect or midline shift. Ventricles stable in size without hydrocephalus. No extra-axial fluid collection. Mildly prominent cortical vein overlying the right cerebral convexity noted, stable. Vascular: No abnormal hyperdense vessel. Skull: Scalp soft tissues demonstrate no acute finding. Calvarium intact. Sinuses/Orbits: Globes and orbital soft tissues within normal limits. Mild mucosal thickening present about the left sphenoid sinus. Paranasal sinuses are otherwise clear. Mastoid air cells and middle ear cavities are well pneumatized and free of fluid. Other: None. IMPRESSION: 1. No acute intracranial abnormality. 2. Grossly stable appearance of patient's known pituitary macro adenoma. 3. Underlying atrophy with chronic small vessel ischemic disease, stable. Electronically Signed   By: Rise Mu M.D.   On: 03/16/2023 01:55   CT Cervical Spine Wo Contrast  Result Date: 03/16/2023 CLINICAL DATA:  Neck trauma (Age >= 65y) EXAM: CT CERVICAL SPINE WITHOUT CONTRAST TECHNIQUE: Multidetector CT imaging of the cervical spine was  performed without intravenous contrast. Multiplanar CT image reconstructions were also generated. RADIATION DOSE REDUCTION: This exam was performed according to the departmental dose-optimization program which includes automated exposure control, adjustment of the mA and/or kV according to patient size and/or use of iterative reconstruction technique. COMPARISON:  None Available. FINDINGS: Alignment: Reversal normal cervical lordosis due to positioning and degenerative changes. Grade 1 anterolisthesis of C2 on C3, C3 on C4, C4 on C5. Skull base and vertebrae: Multilevel mild-to-moderate degenerative changes spine most severe at the C5-6 level. No acute fracture. No aggressive appearing focal osseous lesion or focal pathologic process. Soft tissues and spinal canal: No prevertebral fluid or swelling. No visible canal hematoma. Upper chest: Biapical pleural/pulmonary scarring. Other: None. IMPRESSION: No acute displaced fracture or traumatic listhesis of the cervical spine. Electronically Signed   By: Tish Frederickson M.D.   On: 03/16/2023 01:53   DG Hip Unilat W or Wo Pelvis 2-3 Views Left  Result Date: 03/16/2023 CLINICAL DATA:  Status post fall with subsequent left hip pain. EXAM: DG HIP (WITH OR WITHOUT PELVIS) 2-3V LEFT COMPARISON:  None Available. FINDINGS: There is no evidence of an acute hip fracture or dislocation. Moderate to marked severity degenerative changes are seen in the form of joint space narrowing and acetabular sclerosis. IMPRESSION: Moderate to marked severity degenerative changes without evidence of an acute osseous abnormality. Electronically Signed   By: Aram Candela M.D.   On: 03/16/2023 01:49   DG Knee Complete 4 Views Left  Result Date: 03/16/2023 CLINICAL DATA:  Status post fall. EXAM: LEFT KNEE - COMPLETE 4+ VIEW COMPARISON:  None Available. FINDINGS: No evidence of an acute fracture, dislocation, or joint effusion. Marked severity medial and lateral chondrocalcinosis is seen.  Marked severity tricompartmental joint space narrowing is also noted. Soft tissues are unremarkable. IMPRESSION: Marked severity tricompartmental degenerative changes. Electronically Signed   By: Aram Candela M.D.   On: 03/16/2023 01:48     PROCEDURES:  Critical Care performed: No    Procedures    IMPRESSION / MDM / ASSESSMENT AND PLAN / ED COURSE  I reviewed the triage vital signs and the nursing notes.  Patient here with left hip and knee pain after fall.  The patient is on the cardiac monitor to evaluate for evidence of arrhythmia and/or significant heart rate changes.   DIFFERENTIAL DIAGNOSIS (includes but not limited to):   Contusion, doubt fracture, dislocation, intracranial hemorrhage, cervical spine fracture, skull fracture  Patient's presentation  is most consistent with acute presentation with potential threat to life or bodily function.  PLAN: Will obtain CT head and cervical spine, x-ray of the left knee and hip.  Neurovascular intact distally.  He declines any pain medication stating that he has not any pain at rest.  There is no leg length discrepancy on exam.   MEDICATIONS GIVEN IN ED: Medications - No data to display   ED COURSE: Imaging reviewed and interpreted by myself and the radiologist and shows no acute traumatic injury.  Patient has been able to get up here and ambulate with his walker which is his baseline.  Family is comfortable taking him home.  Will discharge with pain medication to use at home as needed.  Recommended that he keep his walker with him at all times.  He is already receiving physical therapy 2 times a week.  Will have him follow-up with his PCP if continues to have discomfort.   At this time, I do not feel there is any life-threatening condition present. I reviewed all nursing notes, vitals, pertinent previous records.  All lab and urine results, EKGs, imaging ordered have been independently reviewed and interpreted by myself.  I  reviewed all available radiology reports from any imaging ordered this visit.  Based on my assessment, I feel the patient is safe to be discharged home without further emergent workup and can continue workup as an outpatient as needed. Discussed all findings, treatment plan as well as usual and customary return precautions.  They verbalize understanding and are comfortable with this plan.  Outpatient follow-up has been provided as needed.  All questions have been answered.    CONSULTS:  none   OUTSIDE RECORDS REVIEWED: Reviewed last neurology note on 02/11/2023.       FINAL CLINICAL IMPRESSION(S) / ED DIAGNOSES   Final diagnoses:  Fall, initial encounter  Left hip pain  Acute pain of left knee     Rx / DC Orders   ED Discharge Orders          Ordered    HYDROcodone-acetaminophen (NORCO/VICODIN) 5-325 MG tablet  Every 6 hours PRN        03/16/23 0237             Note:  This document was prepared using Dragon voice recognition software and may include unintentional dictation errors.   Nicola Quesnell, Layla Maw, DO 03/16/23 [phone]

## 2023-03-16 NOTE — Discharge Instructions (Signed)

## 2023-03-16 NOTE — ED Triage Notes (Signed)
BIB [facility] from home for a fall. No LOC. Left hip and knee pain. Pt is caox4. Pt has been going to PT . When he stands the leg is painful.

## 2023-03-16 NOTE — ED Notes (Signed)
This RN assisted pt to ambulate with his walker. Pt did well. Pt does favor his weaker side. Pt is currently receiving PT twice weekly for same. Pt educated to use his walker at all times, as he did not have it at time of fall today.

## 2023-06-04 ENCOUNTER — Encounter: Payer: Self-pay | Admitting: Dermatology

## 2023-06-04 ENCOUNTER — Ambulatory Visit: Payer: Medicare HMO | Admitting: Dermatology

## 2023-06-04 DIAGNOSIS — Z79899 Other long term (current) drug therapy: Secondary | ICD-10-CM

## 2023-06-04 DIAGNOSIS — L820 Inflamed seborrheic keratosis: Secondary | ICD-10-CM | POA: Diagnosis not present

## 2023-06-04 DIAGNOSIS — Z8589 Personal history of malignant neoplasm of other organs and systems: Secondary | ICD-10-CM

## 2023-06-04 DIAGNOSIS — D1801 Hemangioma of skin and subcutaneous tissue: Secondary | ICD-10-CM

## 2023-06-04 DIAGNOSIS — L814 Other melanin hyperpigmentation: Secondary | ICD-10-CM

## 2023-06-04 DIAGNOSIS — B353 Tinea pedis: Secondary | ICD-10-CM

## 2023-06-04 DIAGNOSIS — L578 Other skin changes due to chronic exposure to nonionizing radiation: Secondary | ICD-10-CM | POA: Diagnosis not present

## 2023-06-04 DIAGNOSIS — L570 Actinic keratosis: Secondary | ICD-10-CM

## 2023-06-04 DIAGNOSIS — D229 Melanocytic nevi, unspecified: Secondary | ICD-10-CM

## 2023-06-04 DIAGNOSIS — W908XXA Exposure to other nonionizing radiation, initial encounter: Secondary | ICD-10-CM | POA: Diagnosis not present

## 2023-06-04 DIAGNOSIS — Z86007 Personal history of in-situ neoplasm of skin: Secondary | ICD-10-CM

## 2023-06-04 DIAGNOSIS — L821 Other seborrheic keratosis: Secondary | ICD-10-CM

## 2023-06-04 DIAGNOSIS — Z7189 Other specified counseling: Secondary | ICD-10-CM

## 2023-06-04 DIAGNOSIS — Z85828 Personal history of other malignant neoplasm of skin: Secondary | ICD-10-CM

## 2023-06-04 DIAGNOSIS — L853 Xerosis cutis: Secondary | ICD-10-CM

## 2023-06-04 DIAGNOSIS — Z1283 Encounter for screening for malignant neoplasm of skin: Secondary | ICD-10-CM

## 2023-06-04 MED ORDER — KETOCONAZOLE 2 % EX CREA: 1.0000 | TOPICAL_CREAM | Freq: Every evening | CUTANEOUS | 11 refills | Status: AC

## 2023-06-04 NOTE — Progress Notes (Signed)
 Follow-Up Visit   Subjective  Richard Acosta is a 88 y.o. male who presents for the following: Skin Cancer Screening and Full Body Skin Exam, hx of BCC, SCC, SCC IS, AKs  The patient presents for Total-Body Skin Exam (TBSE) for skin cancer screening and mole check. The patient has spots, moles and lesions to be evaluated, some may be new or changing and the patient may have concern these could be cancer.    The following portions of the chart were reviewed this encounter and updated as appropriate: medications, allergies, medical history  Review of Systems:  No other skin or systemic complaints except as noted in HPI or Assessment and Plan.  Objective  Well appearing patient in no apparent distress; mood and affect are within normal limits.  A full examination was performed including scalp, head, eyes, ears, nose, lips, neck, chest, axillae, abdomen, back, buttocks, bilateral upper extremities, bilateral lower extremities, hands, feet, fingers, toes, fingernails, and toenails. All findings within normal limits unless otherwise noted below.   Relevant physical exam findings are noted in the Assessment and Plan.  Scalp x 9, L sideburn x 1 (10) Pink scaly macules back x 5 (5) Stuck on waxy paps with erythema  Assessment & Plan   SKIN CANCER SCREENING PERFORMED TODAY.  ACTINIC DAMAGE - Chronic condition, secondary to cumulative UV/sun exposure - diffuse scaly erythematous macules with underlying dyspigmentation - Recommend daily broad spectrum sunscreen SPF 30+ to sun-exposed areas, reapply every 2 hours as needed.  - Staying in the shade or wearing long sleeves, sun glasses (UVA+UVB protection) and wide brim hats (4-inch brim around the entire circumference of the hat) are also recommended for sun protection.  - Call for new or changing lesions.  LENTIGINES, SEBORRHEIC KERATOSES, HEMANGIOMAS - Benign normal skin lesions - Benign-appearing - Call for any changes  MELANOCYTIC  NEVI - Tan-brown and/or pink-flesh-colored symmetric macules and papules - Benign appearing on exam today - Observation - Call clinic for new or changing moles - Recommend daily use of broad spectrum spf 30+ sunscreen to sun-exposed areas.   HISTORY OF BASAL CELL CARCINOMA OF THE SKIN - No evidence of recurrence today - Recommend regular full body skin exams - Recommend daily broad spectrum sunscreen SPF 30+ to sun-exposed areas, reapply every 2 hours as needed.  - Call if any new or changing lesions are noted between office visits  - L ant lat top of shoulder, R mastoid postauricular  HISTORY OF SQUAMOUS CELL CARCINOMA OF THE SKIN - No evidence of recurrence today - No lymphadenopathy - Recommend regular full body skin exams - Recommend daily broad spectrum sunscreen SPF 30+ to sun-exposed areas, reapply every 2 hours as needed.  - Call if any new or changing lesions are noted between office visits - Crown scalp  HISTORY OF SQUAMOUS CELL CARCINOMA IN SITU OF THE SKIN - No evidence of recurrence today - Recommend regular full body skin exams - Recommend daily broad spectrum sunscreen SPF 30+ to sun-exposed areas, reapply every 2 hours as needed.  - Call if any new or changing lesions are noted between office visits  - L lat elbow  Xerosis - diffuse xerotic patches - recommend gentle, hydrating skin care - gentle skin care handout given - continue Cerave but alternate with Amlactin Rapid Relief  TINEA PEDIS Bil feet Exam: Scaling bil feet  Chronic and persistent condition with duration or expected duration over one year. Condition is symptomatic / bothersome to patient. Not to goal.  Treatment Plan: Start Ketoconazole 2% cr qhs to feet   Long term medication management.  Patient is using long term (months to years) prescription medication  to control their dermatologic condition.  These medications require periodic monitoring to evaluate for efficacy and side effects and  may require periodic laboratory monitoring.  AK (ACTINIC KERATOSIS) (10) Scalp x 9, L sideburn x 1 (10) Actinic keratoses are precancerous spots that appear secondary to cumulative UV radiation exposure/sun exposure over time. They are chronic with expected duration over 1 year. A portion of actinic keratoses will progress to squamous cell carcinoma of the skin. It is not possible to reliably predict which spots will progress to skin cancer and so treatment is recommended to prevent development of skin cancer.  Recommend daily broad spectrum sunscreen SPF 30+ to sun-exposed areas, reapply every 2 hours as needed.  Recommend staying in the shade or wearing long sleeves, sun glasses (UVA+UVB protection) and wide brim hats (4-inch brim around the entire circumference of the hat). Call for new or changing lesions. Destruction of lesion - Scalp x 9, L sideburn x 1 (10) Complexity: simple   Destruction method: cryotherapy   Informed consent: discussed and consent obtained   Timeout:  patient name, date of birth, surgical site, and procedure verified Lesion destroyed using liquid nitrogen: Yes   Region frozen until ice ball extended beyond lesion: Yes   Outcome: patient tolerated procedure well with no complications   Post-procedure details: wound care instructions given   INFLAMED SEBORRHEIC KERATOSIS (5) back x 5 (5) Symptomatic, irritating, patient would like treated. Destruction of lesion - back x 5 (5) Complexity: simple   Destruction method: cryotherapy   Informed consent: discussed and consent obtained   Timeout:  patient name, date of birth, surgical site, and procedure verified Lesion destroyed using liquid nitrogen: Yes   Region frozen until ice ball extended beyond lesion: Yes   Outcome: patient tolerated procedure well with no complications   Post-procedure details: wound care instructions given   Return in about 1 year (around 06/03/2024) for TBSE, Hx of BCC, Hx of SCC, Hx of SCC  IS, Hx of AKs.  I, Ardis Rowan, RMA, am acting as scribe for Armida Sans, MD .   Documentation: I have reviewed the above documentation for accuracy and completeness, and I agree with the above.  Armida Sans, MD

## 2023-06-04 NOTE — Patient Instructions (Addendum)
 Dryness on body Continue Cerave cream daily or  you can alternate the Cerave cream with Amlactin Rapid Relief using one of the creams once daily Recommend mild soap like Dove Soap  Cryotherapy Aftercare  Wash gently with soap and water everyday.   Apply Vaseline and Band-Aid daily until healed.     Due to recent changes in healthcare laws, you may see results of your pathology and/or laboratory studies on MyChart before the doctors have had a chance to review them. We understand that in some cases there may be results that are confusing or concerning to you. Please understand that not all results are received at the same time and often the doctors may need to interpret multiple results in order to provide you with the best plan of care or course of treatment. Therefore, we ask that you please give [place] 2 business days to thoroughly review all your results before contacting the office for clarification. Should we see a critical lab result, you will be contacted sooner.   If You Need Anything After Your Visit  If you have any questions or concerns for your doctor, please call our main line at [phone] and press option 4 to reach your doctor's medical assistant. If no one answers, please leave a voicemail as directed and we will return your call as soon as possible. Messages left after 4 pm will be answered the following business day.   You may also send [place] a message via MyChart. We typically respond to MyChart messages within 1-2 business days.  For prescription refills, please ask your pharmacy to contact our office. Our fax number is [phone].  If you have an urgent issue when the clinic is closed that cannot wait until the next business day, you can page your doctor at the number below.    Please note that while we do our best to be available for urgent issues outside of office hours, we are not available 24/7.   If you have an urgent issue and are unable to reach [place], you may choose  to seek medical care at your doctor's office, retail clinic, urgent care center, or emergency room.  If you have a medical emergency, please immediately call 911 or go to the emergency department.  Pager Numbers  - Dr. Gwen Pounds: [phone]  - Dr. Roseanne Reno: [phone]  - Dr. Katrinka Blazing: [phone]   In the event of inclement weather, please call our main line at [phone] for an update on the status of any delays or closures.  Dermatology Medication Tips: Please keep the boxes that topical medications come in in order to help keep track of the instructions about where and how to use these. Pharmacies typically print the medication instructions only on the boxes and not directly on the medication tubes.   If your medication is too expensive, please contact our office at [phone] option 4 or send [place] a message through MyChart.   We are unable to tell what your co-pay for medications will be in advance as this is different depending on your insurance coverage. However, we may be able to find a substitute medication at lower cost or fill out paperwork to get insurance to cover a needed medication.   If a prior authorization is required to get your medication covered by your insurance company, please allow [place] 1-2 business days to complete this process.  Drug prices often vary depending on where the prescription is filled and some pharmacies may offer cheaper prices.  The website www.goodrx.com  contains coupons for medications through different pharmacies. The prices here do not account for what the cost may be with help from insurance (it may be cheaper with your insurance), but the website can give you the price if you did not use any insurance.  - You can print the associated coupon and take it with your prescription to the pharmacy.  - You may also stop by our office during regular business hours and pick up a GoodRx coupon card.  - If you need your prescription sent electronically to  a different pharmacy, notify our office through [facility] or by phone at [phone] option 4.     Si Usted Necesita Algo Despus de Su Visita  Tambin puede enviarnos un mensaje a travs de Clinical cytogeneticist. Por lo general respondemos a los mensajes de MyChart en el transcurso de 1 a 2 das hbiles.  Para renovar recetas, por favor pida a su farmacia que se ponga en contacto con nuestra oficina. Annie Sable de fax es [place] [phone].  Si tiene un asunto urgente cuando la clnica est cerrada y que no puede esperar hasta el siguiente da hbil, puede llamar/localizar a su doctor(a) al nmero que aparece a continuacin.   Por favor, tenga en cuenta que aunque hacemos todo lo posible para estar disponibles para asuntos urgentes fuera del horario de [place], no estamos disponibles las 24 horas del da, los 7 [address]  [address] de la [place].   Si tiene un problema urgente y no puede comunicarse con nosotros, puede optar por buscar atencin mdica  en el consultorio de su doctor(a), en una clnica privada, en un centro de atencin urgente o en una sala de emergencias.  Si tiene Engineer, drilling, por favor llame inmediatamente al 911 o vaya a la sala de emergencias.  Nmeros de bper  - Dr. Gwen Pounds: [phone]  - Dra. Roseanne Reno: [phone]  - Dr. Katrinka Blazing: [phone]   En caso de inclemencias del tiempo, por favor llame a Lacy Duverney principal al [phone] para una actualizacin sobre el [place] de cualquier retraso o cierre.  Consejos para la medicacin en dermatologa: Por favor, guarde las cajas en las que vienen los medicamentos de uso tpico para ayudarle a seguir las instrucciones sobre dnde y cmo usarlos. Las farmacias generalmente imprimen las instrucciones del medicamento slo en las cajas y no directamente en los tubos del [place].   Si su medicamento es muy caro, por favor, pngase en contacto con Rolm Gala llamando al [phone] y presione la opcin 4 o envenos  un mensaje a travs de Clinical cytogeneticist.   No podemos decirle cul ser su copago por los medicamentos por adelantado ya que esto es diferente dependiendo de la cobertura de su seguro. Sin embargo, es posible que podamos encontrar un medicamento sustituto a Audiological scientist un formulario para que el seguro cubra el medicamento que se considera necesario.   Si se requiere una autorizacin previa para que su compaa de seguros [place] su medicamento, por favor permtanos de 1 a 2 das hbiles para completar [address]  Los precios de los medicamentos varan con frecuencia dependiendo del Environmental consultant de dnde se surte la receta y alguna farmacias pueden ofrecer precios ms baratos.  El sitio web www.goodrx.com tiene cupones para medicamentos de Health and safety inspector. Los precios aqu no tienen en cuenta lo que podra costar con la ayuda del seguro (puede ser ms barato con su seguro), pero el sitio web puede darle el precio si no utiliz Tourist information centre manager.  - Puede imprimir  el cupn correspondiente y llevarlo con su receta a la farmacia.  - Tambin puede pasar por nuestra oficina durante el horario de atencin regular y Education officer, museum una tarjeta de cupones de GoodRx.  - Si necesita que su receta se enve electrnicamente a una farmacia diferente, informe a nuestra oficina a travs de MyChart de [place] o por telfono llamando al [phone] y presione la opcin 4.

## 2023-06-08 ENCOUNTER — Encounter: Payer: Self-pay | Admitting: Dermatology

## 2024-01-19 ENCOUNTER — Other Ambulatory Visit: Payer: Self-pay | Admitting: Ophthalmology

## 2024-01-19 DIAGNOSIS — H543 Unqualified visual loss, both eyes: Secondary | ICD-10-CM

## 2024-01-21 ENCOUNTER — Ambulatory Visit
Admission: RE | Admit: 2024-01-21 | Discharge: 2024-01-21 | Disposition: A | Discharge status: Home / Self Care | Source: Ambulatory Visit | Attending: Ophthalmology | Admitting: Ophthalmology

## 2024-01-21 DIAGNOSIS — I6522 Occlusion and stenosis of left carotid artery: Secondary | ICD-10-CM | POA: Insufficient documentation

## 2024-01-21 DIAGNOSIS — H543 Unqualified visual loss, both eyes: Secondary | ICD-10-CM | POA: Insufficient documentation

## 2024-02-25 ENCOUNTER — Ambulatory Visit (HOSPITAL_BASED_OUTPATIENT_CLINIC_OR_DEPARTMENT_OTHER): Admitting: Cardiology

## 2024-02-26 ENCOUNTER — Ambulatory Visit: Admitting: Cardiology

## 2024-06-08 ENCOUNTER — Ambulatory Visit: Admitting: Cardiology

## 2024-06-09 ENCOUNTER — Ambulatory Visit: Payer: Medicare HMO | Admitting: Dermatology

## 2024-06-09 ENCOUNTER — Ambulatory Visit: Admitting: Dermatology
